# Patient Record
Sex: Female | Born: 1985 | Race: White | Hispanic: No | Smoking: Current some day smoker
Health system: Southern US, Community
[De-identification: ages and names within clinical notes are randomized; demographics above are authoritative.]

## PROBLEM LIST (undated history)

## (undated) DIAGNOSIS — F419 Anxiety disorder, unspecified: Secondary | ICD-10-CM

## (undated) DIAGNOSIS — G8929 Other chronic pain: Secondary | ICD-10-CM

## (undated) DIAGNOSIS — I10 Essential (primary) hypertension: Secondary | ICD-10-CM

## (undated) DIAGNOSIS — R51 Headache: Secondary | ICD-10-CM

## (undated) DIAGNOSIS — K529 Noninfective gastroenteritis and colitis, unspecified: Secondary | ICD-10-CM

## (undated) DIAGNOSIS — R519 Headache, unspecified: Secondary | ICD-10-CM

## (undated) DIAGNOSIS — N83209 Unspecified ovarian cyst, unspecified side: Secondary | ICD-10-CM

## (undated) HISTORY — PX: WISDOM TOOTH EXTRACTION: SHX21

## (undated) HISTORY — DX: Headache: R51

## (undated) HISTORY — DX: Anxiety disorder, unspecified: F41.9

## (undated) HISTORY — DX: Other chronic pain: G89.29

## (undated) HISTORY — DX: Headache, unspecified: R51.9

---

## 2006-10-04 ENCOUNTER — Other Ambulatory Visit: Admission: RE | Admit: 2006-10-04 | Discharge: 2006-10-04 | Payer: Self-pay | Admitting: Obstetrics & Gynecology

## 2008-04-17 ENCOUNTER — Inpatient Hospital Stay (HOSPITAL_COMMUNITY): Admission: AD | Admit: 2008-04-17 | Discharge: 2008-04-19 | Payer: Self-pay | Admitting: Obstetrics and Gynecology

## 2008-04-21 ENCOUNTER — Ambulatory Visit: Admission: RE | Admit: 2008-04-21 | Discharge: 2008-04-21 | Payer: Self-pay | Admitting: Obstetrics and Gynecology

## 2009-06-07 ENCOUNTER — Ambulatory Visit: Payer: Self-pay | Admitting: Interventional Radiology

## 2009-06-07 ENCOUNTER — Emergency Department (HOSPITAL_BASED_OUTPATIENT_CLINIC_OR_DEPARTMENT_OTHER): Admission: EM | Admit: 2009-06-07 | Discharge: 2009-06-07 | Payer: Self-pay | Admitting: Emergency Medicine

## 2010-07-25 ENCOUNTER — Emergency Department (HOSPITAL_BASED_OUTPATIENT_CLINIC_OR_DEPARTMENT_OTHER)
Admission: EM | Admit: 2010-07-25 | Discharge: 2010-07-25 | Disposition: A | Discharge status: Home / Self Care | Payer: Medicaid Other | Attending: Emergency Medicine | Admitting: Emergency Medicine

## 2010-07-25 DIAGNOSIS — R110 Nausea: Secondary | ICD-10-CM | POA: Insufficient documentation

## 2010-07-25 DIAGNOSIS — R5381 Other malaise: Secondary | ICD-10-CM | POA: Insufficient documentation

## 2010-07-25 DIAGNOSIS — R5383 Other fatigue: Secondary | ICD-10-CM | POA: Insufficient documentation

## 2010-07-25 LAB — URINALYSIS, ROUTINE W REFLEX MICROSCOPIC
Hgb urine dipstick: NEGATIVE
Specific Gravity, Urine: 1.011 (ref 1.005–1.030)
Urine Glucose, Fasting: NEGATIVE mg/dL
pH: 7.5 (ref 5.0–8.0)

## 2010-07-25 LAB — CBC
HCT: 41.8 % (ref 36.0–46.0)
MCV: 85.7 fL (ref 78.0–100.0)
RBC: 4.88 MIL/uL (ref 3.87–5.11)
WBC: 5.6 10*3/uL (ref 4.0–10.5)

## 2010-07-25 LAB — DIFFERENTIAL
Basophils Absolute: 0 10*3/uL (ref 0.0–0.1)
Lymphocytes Relative: 46 % (ref 12–46)
Lymphs Abs: 2.5 10*3/uL (ref 0.7–4.0)
Neutro Abs: 2.3 10*3/uL (ref 1.7–7.7)
Neutrophils Relative %: 42 % — ABNORMAL LOW (ref 43–77)

## 2010-07-25 LAB — BASIC METABOLIC PANEL
BUN: 11 mg/dL (ref 6–23)
Chloride: 106 mEq/L (ref 96–112)
Glucose, Bld: 90 mg/dL (ref 70–99)
Potassium: 4 mEq/L (ref 3.5–5.1)
Sodium: 141 mEq/L (ref 135–145)

## 2010-07-25 LAB — PREGNANCY, URINE: Preg Test, Ur: NEGATIVE

## 2010-11-01 NOTE — H&P (Signed)
NAMEALVETA, Williams NO.:  [id number]   MEDICAL RECORD NO.:  [id number]          PATIENT TYPE:  INP   LOCATION:  9167                          FACILITY:  [facility]   PHYSICIAN:  Osborn Coho, M.D.   DATE OF BIRTH:  [birth year]   DATE OF ADMISSION:  04/17/2008  DATE OF DISCHARGE:                              HISTORY & PHYSICAL   [name] is a 25 year old single white female primigravida at 40-3/7  weeks' gestation per an [facility] of April 14, 2008 who presents in early  labor.  She reports contractions which were irregular throughout the day  yesterday that became approximately 5 minutes apart around 11 p.m., and  then since midnight have been somewhere around 2 minutes apart.  She  reports good fetal movement.  No leakage of fluid, vaginal bleeding, UTI  or PIH signs or symptoms, fever, cough, shortness of breath, nausea,  vomiting or diarrhea.  She has been followed by CNM service at [facility].  History remarkable for:   1. Conception on OCPs.  2. History of frequent headaches.  3. History of abnormal Pap.  4. Best EDC is by 7-week ultrasound at [facility].   OBSTETRICAL HISTORY:  She is a primigravida.   PRENATAL LABORATORIES:  Her blood type is AB positive, Rh antibody  screen negative, RPR nonreactive, rubella titer immune, hepatitis  surface antigen negative, HIV nonreactive, declined cystic fibrosis  screen.  Pap and gonorrhea and chlamydia cultures were negative as were  done on April 29th.  Hemoglobin at that time 12.3, hematocrit 35.6, and  platelets were 197.  Third trimester group beta strep is negative; 1-  hour Glucola was within normal limits equal to 93.   ALLERGIES:  She denies medication or latex allergies.  She does have  some seasonal allergies.   MENSTRUAL HISTORY:  She reports menarche at age 64, monthly cycles.  No  abnormalities.  She had a certain LMP of July 18, 2007 giving her an  [facility] April 23, 2008.  However, she did have a  7-week ultrasound at  [facility] giving her a best [facility] of April 14, 2008 which is  what we have used.  She had reported being on Yaz, and had missed 1 week  of pills.  She stopped when she did find out she was pregnant so  conceived while on [place].  She had an abnormal Pap smear in 2006, and had  a subsequent colposcopy which was within normal limits.  She was treated  for chlamydia in 2008, varicella as a child.   SURGICAL HISTORY:  Remarkable for wisdom teeth in approximately 2007.   GENETIC HISTORY:  Remarkable for 1st cousin on paternal side born with  heart disease and subsequently died; then she has a paternal 1st cousin  with heart disease and also mental retardation.   FAMILY HISTORY:  Maternal grandmother heart disease.  Her dad has  chronic hypertension and is on meds.  Paternal grandfather and maternal  grandfather lung cancer and deceased.  Mom bipolar, ADHD, brother with  ADHD.   SOCIAL HISTORY:  She is a  single white female.  She is of [place]  faith.  Father of baby's name is Mckenzie Williams.  He is not present on  arrival today and unsure if he is involved in the pregnancy.  Patient is  a part-time Archivist.  The father of baby has had some college.  The patient denied alcohol, tobacco or illicit drug use.   HISTORY OF PRESENT PREGNANCY:  She entered care for an OB interview on  April the 15th.  Her height is 5 feet 8 inches.  Her pregravid weight  was around 149.  She had her new OB workup on April 29th with Nigel Bridgeman, certified nurse midwife.  She planned CNM care.  Pap and  cultures were negative.  She had anatomy ultrasound at 19-1/7 weeks.  They are expecting a boy.  His name is Mckenzie Williams.  Anatomy was within  normal limits.  Size was consistent with EDC of April 14, 2008.  Was  having some occasional cramping, occasional headaches, and was using  ibuprofen p.r.n. with good benefit.  She had her Glucola at 28-1/7  weeks, and it was within  normal limits equal to 93.  The patient did  attend both childbirth and breastfeeding class at [facility] during  the pregnancy in her 3rd trimester.  The patient's pregnancy continued  to progress without any other significant issues or complications.  She  did have a slightly elevated blood pressure at 38-1/7 weeks that was not  high.  It was borderline 138/78 and a recheck was 118/64.  She did  receive the H1N1 shot on Tuesday, October 27th, in our office.  Tuesday  in the office her cervix was 1 cm at 70% and -2.   OBJECTIVE:  Vital signs are unavailable at the time of this dictation.  Fetal heart rate is running at a baseline of 140.  It is reactive,  moderate variability.  She had probably 3 or 4 lates around 3 a.m. after  being on the monitor that spontaneously resolved.  It was during a  period of minimal to absent variability.  She has had a few brief mild  variables.  Overall tracing is reassuring.  Toco uterine contractions  every 2-3 minutes which are moderate on palpation.   PHYSICAL EXAMINATION:  GENERAL:  She does have grimace and labored  breathing with her contractions, but she is alert and oriented x3 and  pleasant in between.  HEENT:  Grossly intact and within normal limits.  CARDIOVASCULAR:  Regular rate and rhythm without murmur.  LUNGS:  Clear to auscultation bilaterally.  ABDOMEN:  Soft, nontender, and gravid.  Cervix is 3%, -1, with a bulging  bag of water.  EXTREMITIES:  No edema, no clonus, and DTRs are within  normal limits.   IMPRESSION:  1. Intrauterine pregnancy at 40-3/7 weeks.  2. Early labor.  3. Few decelerations on fetal heart tracing, but overall very      reassuring.  4. Group beta strep is negative.   PLAN:  1. Admit to birthing suites, Dr. Su Hilt as attending physician.  2. Routine L and D orders.  3. Encouraged Stadol or epidural p.r.n.  The patient is unsure at this      time.  4. AROM p.r.n. augmentation.  5. Consult with MD as  needed.      Candice Denny Levy, CNM      ______________________________  Osborn Coho, M.D.    CHS/MEDQ  D:  04/17/2008  T:  04/17/2008  Job:  [id number] 

## 2011-03-21 LAB — CBC
HCT: 32.8 — ABNORMAL LOW
HCT: 37.4
Hemoglobin: 12.6
MCHC: 33.7
MCV: 89.7
MCV: 90.3
Platelets: 176
RBC: 3.63 — ABNORMAL LOW
RDW: 13.1
WBC: 13.7 — ABNORMAL HIGH
WBC: 14.4 — ABNORMAL HIGH

## 2012-06-18 ENCOUNTER — Emergency Department (HOSPITAL_COMMUNITY): Payer: Self-pay

## 2012-06-18 ENCOUNTER — Encounter (HOSPITAL_COMMUNITY): Payer: Self-pay | Admitting: *Deleted

## 2012-06-18 ENCOUNTER — Emergency Department (HOSPITAL_COMMUNITY)
Admission: EM | Admit: 2012-06-18 | Discharge: 2012-06-18 | Disposition: A | Discharge status: Home / Self Care | Payer: Self-pay | Attending: Emergency Medicine | Admitting: Emergency Medicine

## 2012-06-18 DIAGNOSIS — Z7982 Long term (current) use of aspirin: Secondary | ICD-10-CM | POA: Insufficient documentation

## 2012-06-18 DIAGNOSIS — R1110 Vomiting, unspecified: Secondary | ICD-10-CM | POA: Insufficient documentation

## 2012-06-18 DIAGNOSIS — F17200 Nicotine dependence, unspecified, uncomplicated: Secondary | ICD-10-CM | POA: Insufficient documentation

## 2012-06-18 DIAGNOSIS — Z3202 Encounter for pregnancy test, result negative: Secondary | ICD-10-CM | POA: Insufficient documentation

## 2012-06-18 DIAGNOSIS — R091 Pleurisy: Secondary | ICD-10-CM | POA: Insufficient documentation

## 2012-06-18 DIAGNOSIS — Z79899 Other long term (current) drug therapy: Secondary | ICD-10-CM | POA: Insufficient documentation

## 2012-06-18 LAB — CBC WITH DIFFERENTIAL/PLATELET
Basophils Relative: 0 % (ref 0–1)
Eosinophils Relative: 1 % (ref 0–5)
Lymphocytes Relative: 19 % (ref 12–46)
MCH: 32.4 pg (ref 26.0–34.0)
MCV: 92.6 fL (ref 78.0–100.0)
Monocytes Absolute: 0.5 10*3/uL (ref 0.1–1.0)
Monocytes Relative: 7 % (ref 3–12)
Neutro Abs: 5.3 10*3/uL (ref 1.7–7.7)
Neutrophils Relative %: 73 % (ref 43–77)
Platelets: 177 10*3/uL (ref 150–400)
RDW: 12.4 % (ref 11.5–15.5)

## 2012-06-18 LAB — POCT I-STAT TROPONIN I

## 2012-06-18 LAB — URINALYSIS, ROUTINE W REFLEX MICROSCOPIC
Glucose, UA: NEGATIVE mg/dL
Nitrite: NEGATIVE
Protein, ur: NEGATIVE mg/dL
Urobilinogen, UA: 0.2 mg/dL (ref 0.0–1.0)
pH: 7 (ref 5.0–8.0)

## 2012-06-18 LAB — BASIC METABOLIC PANEL
CO2: 25 mEq/L (ref 19–32)
Calcium: 9.9 mg/dL (ref 8.4–10.5)
GFR calc Af Amer: >90 mL/min (ref >90)
GFR calc non Af Amer: >90 mL/min (ref >90)
Potassium: 3.9 mEq/L (ref 3.5–5.1)
Sodium: 133 mEq/L — ABNORMAL LOW (ref 135–145)

## 2012-06-18 MED ORDER — OXYCODONE-ACETAMINOPHEN 5-325 MG PO TABS
2.0000 | ORAL_TABLET | Freq: Once | ORAL | Status: DC
  Filled 2012-06-18: qty 2

## 2012-06-18 MED ORDER — SODIUM CHLORIDE 0.9 % IV SOLN
1000.0000 mL | Freq: Once | INTRAVENOUS | Status: AC
  Administered 2012-06-18: 1000 mL via INTRAVENOUS

## 2012-06-18 MED ORDER — OXYCODONE-ACETAMINOPHEN 10-650 MG PO TABS
1.0000 | ORAL_TABLET | Freq: Four times a day (QID) | ORAL | Status: DC | PRN
Start: 2012-06-18 — End: 2017-09-19

## 2012-06-18 MED ORDER — ONDANSETRON HCL 4 MG/2ML IJ SOLN
4.0000 mg | INTRAMUSCULAR | Status: DC
  Filled 2012-06-18: qty 2

## 2012-06-18 MED ORDER — ONDANSETRON 8 MG PO TBDP: 8.0000 mg | ORAL_TABLET | Freq: Once | ORAL | Status: DC

## 2012-06-18 MED ORDER — SODIUM CHLORIDE 0.9 % IV SOLN
1000.0000 mL | INTRAVENOUS | Status: DC
  Administered 2012-06-18: 1000 mL via INTRAVENOUS

## 2012-06-18 MED ORDER — NAPROXEN 500 MG PO TABS: 500.0000 mg | ORAL_TABLET | Freq: Two times a day (BID) | ORAL | Status: DC

## 2012-06-18 NOTE — ED Provider Notes (Signed)
History     CSN: [id number]  Arrival date & time 06/18/12  1240   First MD Initiated Contact with Patient 06/18/12 1303      Chief Complaint  Patient presents with  . Chest Pain    (Consider location/radiation/quality/duration/timing/severity/associated sxs/prior treatment) The history is provided by the patient and medical records.    Mckenzie Williams is a 26 y.o. female  with No known medical Hx presents to the Emergency Department complaining of acute, persistent, progressively resolving Chest pain onset 1 hr prior to arrival.  Pt states she was seen 1 week ago and diagnosed with the flu.  She was given an Rx for Azithromycin which she did not fill or take. Patient states at that time she was told she had high heart rate. She states she's been monitoring her heart rate at home. Today she had acute onset of sharp left-sided chest pain, she took her pulse was 120. She became anxious and when she checked it  again she states it was 160. She states she felt a "sharp pop in her chest" she became nervous and came to the emergency department.  She states the pain is made significantly worse when she takes a deep breath.  She also states she has been vomiting 2x per day for the last week but has been able to keep fluids down.  Associated symptoms include cardiac, chest pain.  Nothing makes it better and deep breathing makes it worse.  Pt denies fever, chills, headache, neck pain, prescribed, abdominal pain, nausea vomiting, diarrhea melena, dizziness, syncope.     History reviewed. No pertinent past medical history.  Past Surgical History  Procedure Date  . Wisdom tooth extraction     No family history on file.  History  Substance Use Topics  . Smoking status: Current Every Day Smoker -- 0.5 packs/day for 4 years  . Smokeless tobacco: Not on file  . Alcohol Use: Yes     Comment: 2-3 drinks/ day    OB History    Grav Para Term Preterm Abortions TAB SAB Ect Mult Living           Review of Systems  Constitutional: Negative for fever, diaphoresis, appetite change, fatigue and unexpected weight change.  HENT: Negative for mouth sores and neck stiffness.   Eyes: Negative for visual disturbance.  Respiratory: Negative for cough, chest tightness, shortness of breath and wheezing.   Cardiovascular: Positive for chest pain.  Gastrointestinal: Negative for nausea, vomiting, abdominal pain, diarrhea and constipation.  Genitourinary: Negative for dysuria, urgency, frequency and hematuria.  Skin: Negative for rash.  Neurological: Negative for syncope, light-headedness and headaches.  Psychiatric/Behavioral: Negative for sleep disturbance. The patient is not nervous/anxious.   All other systems reviewed and are negative.    Allergies  Review of patient's allergies indicates no known allergies.  Home Medications   Current Outpatient Rx  Name  Route  Sig  Dispense  Refill  . ALPRAZOLAM 0.5 MG PO TABS   Oral   Take 0.25 mg by mouth daily as needed. Anxiety         . AMPHETAMINE-DEXTROAMPHETAMINE 15 MG PO TABS   Oral   Take 15 mg by mouth daily.         . ASPIRIN 81 MG PO TABS   Oral   Take 81 mg by mouth daily.         . IBUPROFEN 200 MG PO TABS   Oral   Take 200 mg by mouth every 6 (six)  hours as needed. Pain         . NAPROXEN 500 MG PO TABS   Oral   Take 1 tablet (500 mg total) by mouth 2 (two) times daily with a meal.   30 tablet   0   . OXYCODONE-ACETAMINOPHEN 10-650 MG PO TABS   Oral   Take 1 tablet by mouth every 6 (six) hours as needed for pain.   30 tablet   0     BP 134/82  Pulse 107  Temp 97.6 F (36.4 C) (Oral)  Resp 16  SpO2 100%  LMP 06/17/2012  Physical Exam  Nursing note and vitals reviewed. Constitutional: She is oriented to person, place, and time. She appears well-developed and well-nourished. No distress.  HENT:  Head: Normocephalic and atraumatic.  Right Ear: Tympanic membrane, external ear and ear  canal normal.  Left Ear: Tympanic membrane, external ear and ear canal normal.  Nose: Nose normal. Right sinus exhibits no maxillary sinus tenderness and no frontal sinus tenderness. Left sinus exhibits no maxillary sinus tenderness and no frontal sinus tenderness.  Mouth/Throat: Uvula is midline, oropharynx is clear and moist and mucous membranes are normal. Mucous membranes are not dry. No oropharyngeal exudate, posterior oropharyngeal edema, posterior oropharyngeal erythema or tonsillar abscesses.  Eyes: Conjunctivae normal and EOM are normal. Pupils are equal, round, and reactive to light. No scleral icterus.  Neck: Normal range of motion. Neck supple.  Cardiovascular: Regular rhythm, S1 normal, S2 normal, normal heart sounds and intact distal pulses.  Tachycardia present.   No murmur heard. Pulses:      Radial pulses are 2+ on the right side, and 2+ on the left side.       Dorsalis pedis pulses are 2+ on the right side, and 2+ on the left side.       Posterior tibial pulses are 2+ on the right side, and 2+ on the left side.  Pulmonary/Chest: Effort normal and breath sounds normal. No respiratory distress. She has no wheezes.  Abdominal: Soft. Bowel sounds are normal. She exhibits no distension and no mass. There is no tenderness. There is no rebound and no guarding.  Musculoskeletal: Normal range of motion. She exhibits no edema and no tenderness.  Lymphadenopathy:    She has no cervical adenopathy.  Neurological: She is alert and oriented to person, place, and time. She exhibits normal muscle tone. Coordination normal.       Speech is clear and goal oriented Moves extremities without ataxia  Skin: Skin is warm and dry. No rash noted. She is not diaphoretic. No erythema.  Psychiatric: She has a normal mood and affect.    ED Course  Procedures (including critical care time)  Labs Reviewed  BASIC METABOLIC PANEL - Abnormal; Notable for the following:    Sodium 133 (*)     Chloride 95  (*)     Creatinine, Ser 0.42 (*)     All other components within normal limits  URINALYSIS, ROUTINE W REFLEX MICROSCOPIC - Abnormal; Notable for the following:    Specific Gravity, Urine 1.003 (*)     All other components within normal limits  CBC WITH DIFFERENTIAL  PREGNANCY, URINE  D-DIMER, QUANTITATIVE  POCT I-STAT TROPONIN I   Dg Chest 2 View  06/18/2012  *RADIOLOGY REPORT*  Clinical Data: Left-sided chest pain.  Smoker.  CHEST - 2 VIEW  Comparison:  None.  Findings:  The heart size and mediastinal contours are within normal limits.  Both lungs are clear.  No evidence of pneumothorax or pleural effusion.  The visualized skeletal structures are unremarkable.  IMPRESSION: No active cardiopulmonary disease.   Original Report Authenticated By: Myles Rosenthal, M.D.    ECG:  Date: 06/18/2012  Rate: 120  Rhythm: sinus tachycardia  QRS Axis: normal  Intervals: normal  ST/T Wave abnormalities: normal  Conduction Disutrbances:none  Narrative Interpretation: Nonischemic ECG  Old EKG Reviewed: none available    1. Pleurisy       MDM  Mckenzie Williams presents with chest pain and tachycardia.  Patient is to be discharged with recommendation to follow up with PCP in regards to today's hospital visit. Chest pain is not likely of cardiac or pulmonary etiology d/t presentation, negative d-dimer, VSS, no tracheal deviation, no JVD or new murmur, regular rhythm, breath sounds equal bilaterally, EKG without acute abnormalities, negative troponin, and negative CXR. Pt remains tachycardic to 105 after fluid, but dizziness has resolved.  Pt has been advised to begin anti-inflammatory regimen and take percocet for pain.  Pt also advised to return to the ED is CP becomes exertional, associated with diaphoresis or nausea, radiates to left jaw/arm, worsens or becomes concerning in any way. Pt appears reliable for follow up and is agreeable to discharge.   Case has been discussed with Dr. Gwyneth Sprout who agrees with the above plan to discharge.   1. Medications: percocet, naprosyn, usual home medications  2. Treatment: rest, drink plenty of fluids, take medications as prescribed  3. Follow Up: Please followup with your primary doctor for discussion of your diagnoses and further evaluation after today's visit; if you do not have a primary care doctor use the resource guide provided to find one;          Dierdre Forth, PA-C 06/18/12 1659  Zymir Napoli, PA-C 06/18/12 1709

## 2012-06-18 NOTE — ED Provider Notes (Signed)
Medical screening examination/treatment/procedure(s) were performed by non-physician practitioner and as supervising physician I was immediately available for consultation/collaboration.   Gwyneth Sprout, MD 06/18/12 [phone]

## 2012-06-18 NOTE — ED Notes (Signed)
Pt reports flu like symptoms 1 week ago. Pt reports she has been tachycardic for last week. Reports chest pain started today. Left sided chest pain 3/10, stabbing. Pt reports vomit x1 this am and dizziness. Hx of smoking.

## 2012-11-07 ENCOUNTER — Encounter (HOSPITAL_BASED_OUTPATIENT_CLINIC_OR_DEPARTMENT_OTHER): Payer: Self-pay | Admitting: *Deleted

## 2012-11-07 ENCOUNTER — Emergency Department (HOSPITAL_BASED_OUTPATIENT_CLINIC_OR_DEPARTMENT_OTHER)
Admission: EM | Admit: 2012-11-07 | Discharge: 2012-11-07 | Disposition: A | Discharge status: Home / Self Care | Payer: Self-pay | Attending: Emergency Medicine | Admitting: Emergency Medicine

## 2012-11-07 DIAGNOSIS — R110 Nausea: Secondary | ICD-10-CM | POA: Insufficient documentation

## 2012-11-07 DIAGNOSIS — F17200 Nicotine dependence, unspecified, uncomplicated: Secondary | ICD-10-CM | POA: Insufficient documentation

## 2012-11-07 DIAGNOSIS — R599 Enlarged lymph nodes, unspecified: Secondary | ICD-10-CM | POA: Insufficient documentation

## 2012-11-07 DIAGNOSIS — J029 Acute pharyngitis, unspecified: Secondary | ICD-10-CM | POA: Insufficient documentation

## 2012-11-07 LAB — RAPID STREP SCREEN (MED CTR MEBANE ONLY): Streptococcus, Group A Screen (Direct): NEGATIVE

## 2012-11-07 NOTE — ED Provider Notes (Signed)
Medical screening examination/treatment/procedure(s) were performed by non-physician practitioner and as supervising physician I was immediately available for consultation/collaboration.   Hashem Goynes W. Jessilyn Catino, MD 11/07/12 1859 

## 2012-11-07 NOTE — ED Notes (Signed)
Sore throat x 2 days. No known strep exposure.

## 2012-11-07 NOTE — ED Provider Notes (Signed)
History     CSN: [id number]  Arrival date & time 11/07/12  1431   First MD Initiated Contact with Patient 11/07/12 1557      Chief Complaint  Patient presents with  . Sore Throat    (Consider location/radiation/quality/duration/timing/severity/associated sxs/prior treatment) The history is provided by the patient. No language interpreter was used.   Pt is a 26yo female c/o sore throat x2 days associated with "white spots" on tonsils x1 day.  Pt states pain is moderate in severity and worse with swallowing.  Has not tried anything for pain.  Denies fever, cough, n/v/d.  Denies difficulty breathing or swallowing.  Denies sick contacts or recent travel. Pt does not have hx of asthma.    History reviewed. No pertinent past medical history.  History reviewed. No pertinent past surgical history.  No family history on file.  History  Substance Use Topics  . Smoking status: Current Every Day Smoker -- 0.50 packs/day    Types: Cigarettes  . Smokeless tobacco: Not on file  . Alcohol Use: Yes    OB History   Grav Para Term Preterm Abortions TAB SAB Ect Mult Living                  Review of Systems  Constitutional: Negative for fever and chills.  HENT: Positive for sore throat. Negative for drooling, trouble swallowing, dental problem and voice change.   Gastrointestinal: Positive for nausea.  All other systems reviewed and are negative.    Allergies  Review of patient's allergies indicates no known allergies.  Home Medications  No current outpatient prescriptions on file.  BP 142/98  Pulse 108  Temp(Src) 98.3 F (36.8 C) (Oral)  SpO2 98%  Physical Exam  Nursing note and vitals reviewed. Constitutional: She appears well-developed and well-nourished. No distress.  Pt is a pleasant female sitting up in exam bed watching television, resting comfortably. NAD.   HENT:  Head: Normocephalic and atraumatic. No trismus in the jaw.  Right Ear: Hearing, tympanic membrane,  external ear and ear canal normal.  Left Ear: Hearing, tympanic membrane, external ear and ear canal normal.  Nose: Nose normal.  Mouth/Throat: Uvula is midline and mucous membranes are normal. She does not have dentures. No oral lesions. Normal dentition. No dental abscesses, edematous, lacerations or dental caries. Oropharyngeal exudate, posterior oropharyngeal edema and posterior oropharyngeal erythema present. No tonsillar abscesses.  Eyes: Conjunctivae are normal. No scleral icterus.  Neck: Normal range of motion. Neck supple.  No nuchal rigidity or meningeal signs   Cardiovascular: Normal rate, regular rhythm and normal heart sounds.   Pulmonary/Chest: Effort normal and breath sounds normal. No respiratory distress. She has no wheezes. She has no rales. She exhibits no tenderness.  Abdominal: Soft. Bowel sounds are normal. She exhibits no distension and no mass. There is no tenderness. There is no rebound and no guarding.  Abd soft, NDNT, no hepatosplenomegaly   Musculoskeletal: Normal range of motion.  Lymphadenopathy:    She has cervical adenopathy ( anterior).  Neurological: She is alert.  Skin: Skin is warm and dry. She is not diaphoretic.    ED Course  Procedures (including critical care time)  Labs Reviewed  RAPID STREP SCREEN  CULTURE, GROUP A STREP   No results found.   1. Viral pharyngitis       MDM  Pt c/o sore throat x2 days, noticed white spots on tonsils yesterday.  Mild nausea.  No known exposure to strep.  Denies fever, HA, cough,  trouble breathing or swallowing, no abd pain, vomiting or diarrhea.  PE: pt appears well, NAD, Oropharynx: tonsillar exudate with bilateral tonsillar erythema and edema.  No peritonsillar abscess.  Discharged home.  Provided pt with resource guide.  Her previous PCP just retired.    Rapid strep: neg   May use tylenol and ibuprofen as needed for pain or fever.  Salt water gargle. Follow up with PCP or urgent care in 3-4 days if not  improving, sooner if symptoms worsening.  Return to ED if difficulty breathing or swallowing.  Vitals: unremarkable. Discharged in stable condition.            Junius Finner, PA-C 11/07/12 1802

## 2012-11-11 LAB — CULTURE, GROUP A STREP

## 2013-11-02 ENCOUNTER — Emergency Department (HOSPITAL_COMMUNITY): Payer: Medicaid Other

## 2013-11-02 ENCOUNTER — Emergency Department (HOSPITAL_COMMUNITY)
Admission: EM | Admit: 2013-11-02 | Discharge: 2013-11-03 | Disposition: A | Discharge status: Home / Self Care | Payer: Medicaid Other | Attending: Emergency Medicine | Admitting: Emergency Medicine

## 2013-11-02 ENCOUNTER — Encounter (HOSPITAL_COMMUNITY): Payer: Self-pay | Admitting: Emergency Medicine

## 2013-11-02 DIAGNOSIS — S01501A Unspecified open wound of lip, initial encounter: Secondary | ICD-10-CM | POA: Insufficient documentation

## 2013-11-02 DIAGNOSIS — IMO0002 Reserved for concepts with insufficient information to code with codable children: Secondary | ICD-10-CM | POA: Insufficient documentation

## 2013-11-02 DIAGNOSIS — S0180XA Unspecified open wound of other part of head, initial encounter: Secondary | ICD-10-CM | POA: Insufficient documentation

## 2013-11-02 DIAGNOSIS — F17200 Nicotine dependence, unspecified, uncomplicated: Secondary | ICD-10-CM | POA: Insufficient documentation

## 2013-11-02 DIAGNOSIS — Y92410 Unspecified street and highway as the place of occurrence of the external cause: Secondary | ICD-10-CM | POA: Insufficient documentation

## 2013-11-02 DIAGNOSIS — Y9389 Activity, other specified: Secondary | ICD-10-CM | POA: Insufficient documentation

## 2013-11-02 DIAGNOSIS — S025XXA Fracture of tooth (traumatic), initial encounter for closed fracture: Secondary | ICD-10-CM

## 2013-11-02 DIAGNOSIS — S0120XA Unspecified open wound of nose, initial encounter: Secondary | ICD-10-CM | POA: Insufficient documentation

## 2013-11-02 DIAGNOSIS — T07XXXA Unspecified multiple injuries, initial encounter: Secondary | ICD-10-CM

## 2013-11-02 DIAGNOSIS — S0990XA Unspecified injury of head, initial encounter: Secondary | ICD-10-CM | POA: Insufficient documentation

## 2013-11-02 DIAGNOSIS — S0181XA Laceration without foreign body of other part of head, initial encounter: Secondary | ICD-10-CM

## 2013-11-02 DIAGNOSIS — S01512A Laceration without foreign body of oral cavity, initial encounter: Secondary | ICD-10-CM

## 2013-11-02 DIAGNOSIS — S8010XA Contusion of unspecified lower leg, initial encounter: Secondary | ICD-10-CM | POA: Insufficient documentation

## 2013-11-02 DIAGNOSIS — S0121XA Laceration without foreign body of nose, initial encounter: Secondary | ICD-10-CM

## 2013-11-02 DIAGNOSIS — S8000XA Contusion of unspecified knee, initial encounter: Secondary | ICD-10-CM | POA: Insufficient documentation

## 2013-11-02 DIAGNOSIS — S01502A Unspecified open wound of oral cavity, initial encounter: Secondary | ICD-10-CM | POA: Insufficient documentation

## 2013-11-02 MED ORDER — ONDANSETRON HCL 4 MG/2ML IJ SOLN
4.0000 mg | Freq: Once | INTRAMUSCULAR | Status: AC
  Administered 2013-11-02: 4 mg via INTRAVENOUS
  Filled 2013-11-02: qty 2

## 2013-11-02 MED ORDER — FENTANYL CITRATE 0.05 MG/ML IJ SOLN
50.0000 ug | INTRAMUSCULAR | Status: DC | PRN
  Administered 2013-11-02 – 2013-11-03 (×2): 50 ug via INTRAVENOUS
  Filled 2013-11-02 (×2): qty 2

## 2013-11-02 NOTE — ED Notes (Signed)
Pt to CT at this time.
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
## 2013-11-03 ENCOUNTER — Emergency Department (HOSPITAL_COMMUNITY): Payer: Medicaid Other

## 2013-11-03 MED ORDER — HYDROMORPHONE HCL PF 1 MG/ML IJ SOLN
1.0000 mg | Freq: Once | INTRAMUSCULAR | Status: AC
  Administered 2013-11-03: 1 mg via INTRAVENOUS
  Filled 2013-11-03: qty 1

## 2013-11-03 MED ORDER — ONDANSETRON 8 MG PO TBDP: ORAL_TABLET | ORAL | Status: DC

## 2013-11-03 MED ORDER — AMOXICILLIN-POT CLAVULANATE 875-125 MG PO TABS: 1.0000 | ORAL_TABLET | Freq: Two times a day (BID) | ORAL | Status: DC

## 2013-11-03 MED ORDER — ONDANSETRON HCL 4 MG/2ML IJ SOLN: 4.0000 mg | Freq: Once | INTRAMUSCULAR | Status: DC

## 2013-11-03 MED ORDER — HYDROCODONE-ACETAMINOPHEN 5-325 MG PO TABS: 2.0000 | ORAL_TABLET | ORAL | Status: DC | PRN

## 2013-11-03 MED ORDER — FENTANYL CITRATE 0.05 MG/ML IJ SOLN
50.0000 ug | Freq: Once | INTRAMUSCULAR | Status: AC
  Administered 2013-11-03: 50 ug via INTRAVENOUS
  Filled 2013-11-03: qty 2

## 2013-11-03 MED ORDER — WHITE PETROLATUM GEL
Status: AC
  Administered 2013-11-03: 1
  Filled 2013-11-03: qty 5

## 2013-11-03 NOTE — Discharge Instructions (Signed)
1. Medications: Vicodin for pain, Zofran for nausea, augmentin is your antibiotic; usual home medications 2. Treatment: Do not eat or drink anything until you're seen by the dentist tomorrow, keep wounds clean with warm soap and water 3. Follow Up: Please followup with the doctors listed on your discharge instructions  Tooth Injuries A tooth has many layers. The outside is the enamel. The enamel is the white part. Under the enamel is the dentin. Under the dentin is the pulp, the nerves, and the blood vessels. The top part is the crown. The bottom part is the root. Three common tooth injuries include:  Breaks (fractures) usually split the tooth into 2 or more parts.  Shifts of the tooth at the level of the root.  A tooth that comes out. HOME CARE  Minor breaks usually do not require seeing a dentist right away.  Handle tooth fragments by the outside layer. Bring these fragments to the dentist.  A tooth can also be loosened by injury and show no sign of a problem. See a dentist for an X-ray. The X-ray will look for problems below the gum line.  Gently biting into gauze or a towel will help control bleeding. An exposed nerve requires a dental exam and care. Getting help right away is not needed if the pain is controlled.  Only take medicine as told by your dentist.  Doreatha MartinFinish all medicine as told by your dentist.  Avoid eating solid foods and see a dentist within 24 hours. GET HELP RIGHT AWAY IF:   The pain is becoming worse rather than better.  The pain does not get better with medicine.  You have increased puffiness (swelling) or redness in your face near the injured tooth. MAKE SURE YOU:  Understand these instructions.  Will watch your condition.  Will get help right away if you are not doing well or get worse. Document Released: 11/23/2009 Document Revised: 08/28/2011 Document Reviewed: 11/23/2009 [facility] Patient Information 2014 [place], [place].    Abrasion An abrasion  is a cut or scrape of the skin. Abrasions do not extend through all layers of the skin and most heal within 10 days. It is important to care for your abrasion properly to prevent infection. CAUSES  Most abrasions are caused by falling on, or gliding across, the ground or other surface. When your skin rubs on something, the outer and inner layer of skin rubs off, causing an abrasion. DIAGNOSIS  Your caregiver will be able to diagnose an abrasion during a physical exam.  TREATMENT  Your treatment depends on how large and deep the abrasion is. Generally, your abrasion will be cleaned with water and a mild soap to remove any dirt or debris. An antibiotic ointment may be put over the abrasion to prevent an infection. A bandage (dressing) may be wrapped around the abrasion to keep it from getting dirty.  You may need a tetanus shot if:  You cannot remember when you had your last tetanus shot.  You have never had a tetanus shot.  The injury broke your skin. If you get a tetanus shot, your arm may swell, get red, and feel warm to the touch. This is common and not a problem. If you need a tetanus shot and you choose not to have one, there is a rare chance of getting tetanus. Sickness from tetanus can be serious.  HOME CARE INSTRUCTIONS   If a dressing was applied, change it at least once a day or as directed by your caregiver.  If the bandage sticks, soak it off with warm water.   Wash the area with water and a mild soap to remove all the ointment 2 times a day. Rinse off the soap and pat the area dry with a clean towel.   Reapply any ointment as directed by your caregiver. This will help prevent infection and keep the bandage from sticking. Use gauze over the wound and under the dressing to help keep the bandage from sticking.   Change your dressing right away if it becomes wet or dirty.   Only take over-the-counter or prescription medicines for pain, discomfort, or fever as directed by your  caregiver.   Follow up with your caregiver within 24 48 hours for a wound check, or as directed. If you were not given a wound-check appointment, look closely at your abrasion for redness, swelling, or pus. These are signs of infection. SEEK IMMEDIATE MEDICAL CARE IF:   You have increasing pain in the wound.   You have redness, swelling, or tenderness around the wound.   You have pus coming from the wound.   You have a fever or persistent symptoms for more than 2 3 days.  You have a fever and your symptoms suddenly get worse.  You have a bad smell coming from the wound or dressing.  MAKE SURE YOU:   Understand these instructions.  Will watch your condition.  Will get help right away if you are not doing well or get worse. Document Released: 03/15/2005 Document Revised: 05/22/2012 Document Reviewed: 05/09/2011 [facility] Patient Information 2014 [place], [place].

## 2013-11-03 NOTE — ED Notes (Signed)
Pt returned from CT at this time.  

## 2013-11-03 NOTE — ED Notes (Signed)
Cspine clinically cleared byPA

## 2013-11-03 NOTE — ED Provider Notes (Signed)
Medical screening examination/treatment/procedure(s) were performed by non-physician practitioner and as supervising physician I was immediately available for consultation/collaboration.   EKG Interpretation None        Courtney F Horton, MD 11/03/13 0553 

## 2013-11-03 NOTE — ED Notes (Signed)
PA attempting to place tooth back in place and suturing nose and mouth

## 2013-11-03 NOTE — ED Notes (Signed)
Pt to CT at this time.
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
## 2014-01-22 IMAGING — CR DG CHEST 2V
2 series · 2 of 2 positions shown · non-contrast
Comparison: None.

CLINICAL DATA: Left-sided chest pain.  Smoker.

CHEST - 2 VIEW

[w chest pa]
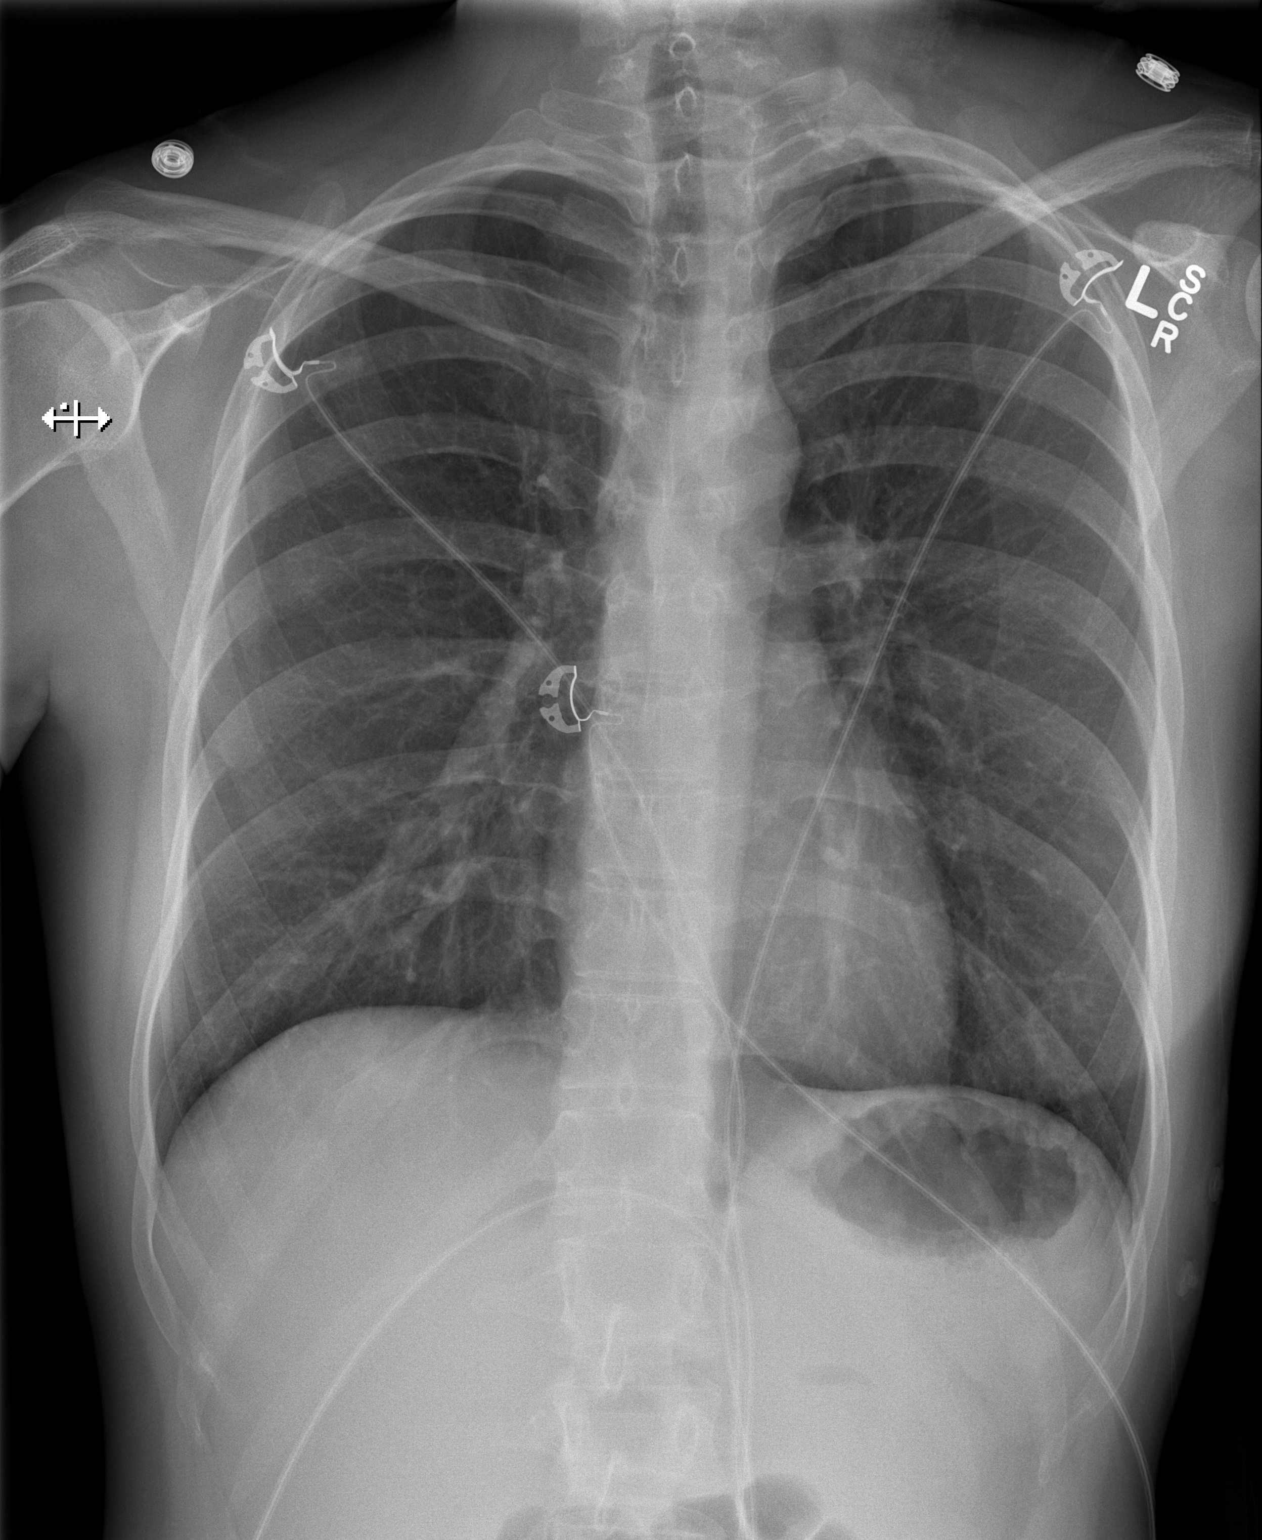

[w chest lat]
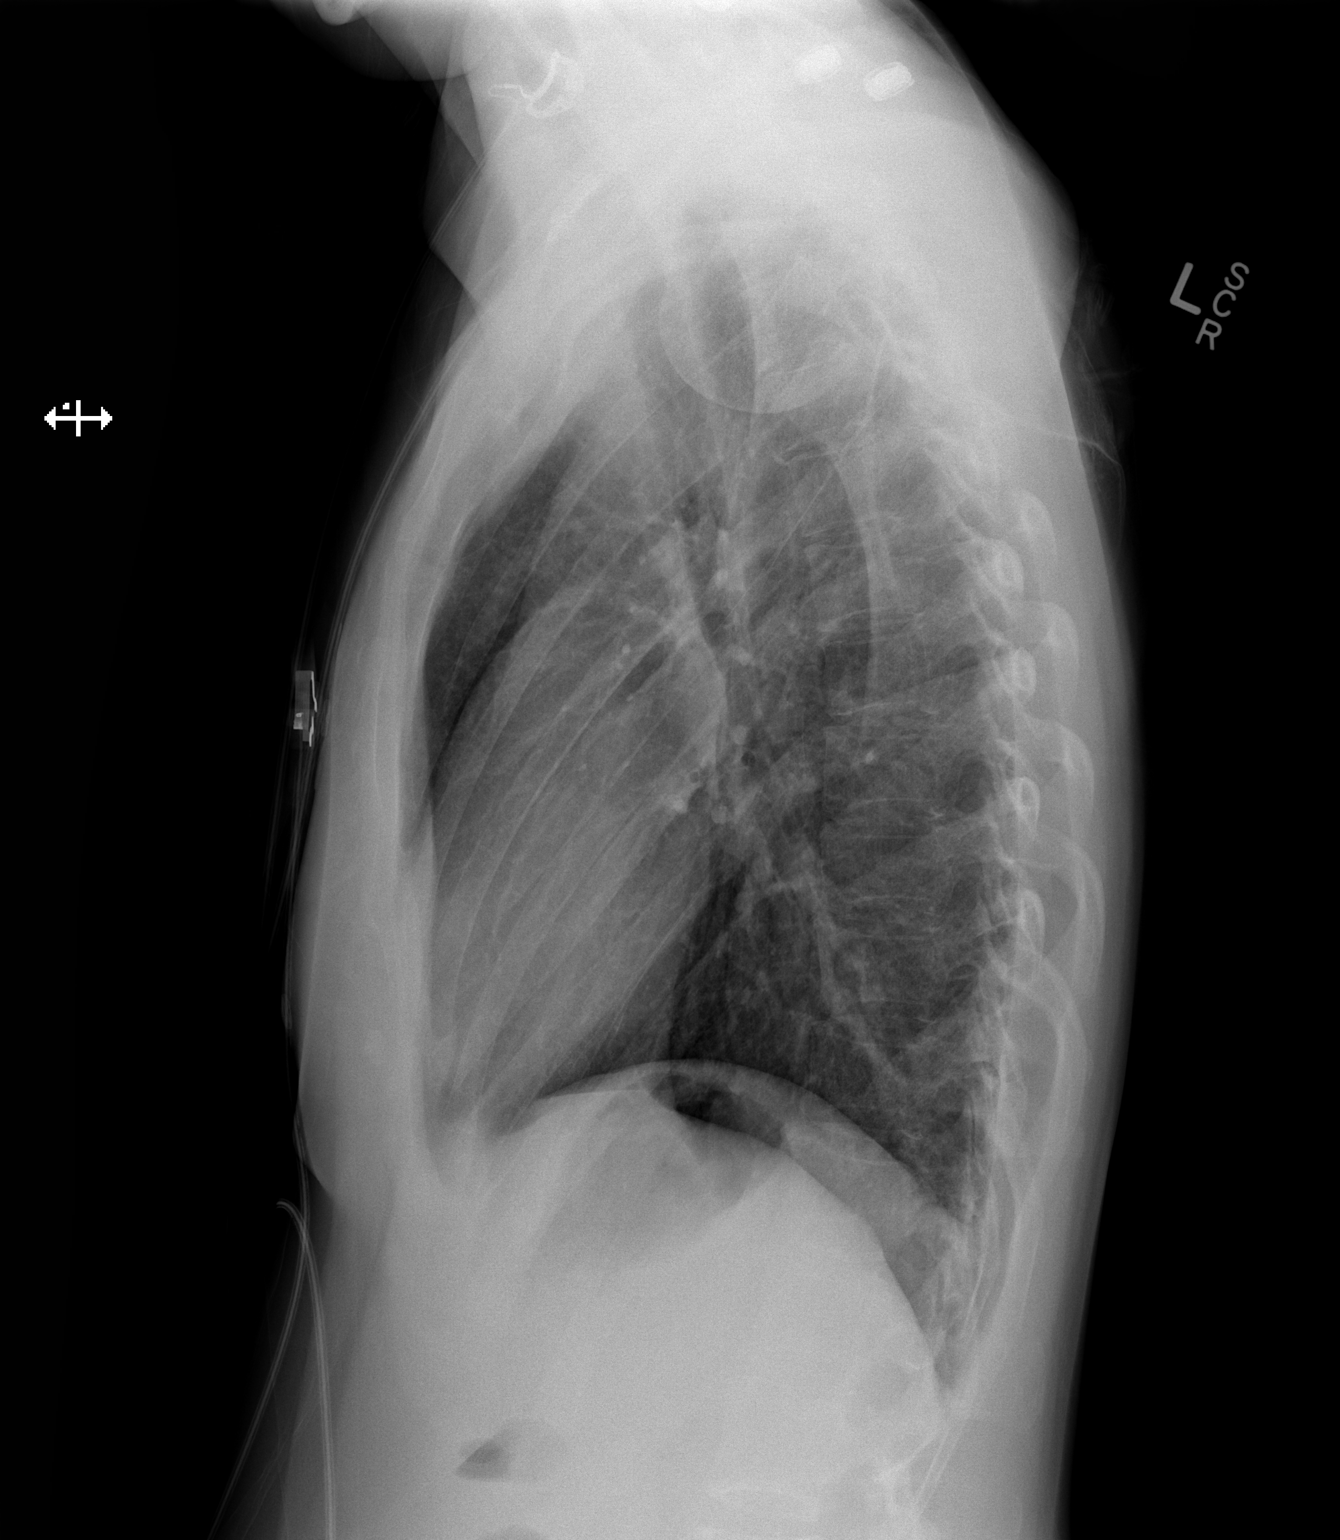

[2 of 2 positions shown; findings below may reference images not displayed]

FINDINGS: The heart size and mediastinal contours are within
normal limits.  Both lungs are clear.  No evidence of pneumothorax
or pleural effusion.  The visualized skeletal structures are
unremarkable.
IMPRESSION: No active cardiopulmonary disease.

## 2014-06-20 ENCOUNTER — Encounter (HOSPITAL_COMMUNITY): Payer: Self-pay | Admitting: Emergency Medicine

## 2014-06-20 ENCOUNTER — Emergency Department (HOSPITAL_COMMUNITY)
Admission: EM | Admit: 2014-06-20 | Discharge: 2014-06-20 | Disposition: A | Discharge status: Home / Self Care | Payer: Medicaid Other | Attending: Emergency Medicine | Admitting: Emergency Medicine

## 2014-06-20 DIAGNOSIS — Z792 Long term (current) use of antibiotics: Secondary | ICD-10-CM | POA: Diagnosis not present

## 2014-06-20 DIAGNOSIS — I10 Essential (primary) hypertension: Secondary | ICD-10-CM | POA: Diagnosis not present

## 2014-06-20 DIAGNOSIS — Z720 Tobacco use: Secondary | ICD-10-CM | POA: Insufficient documentation

## 2014-06-20 DIAGNOSIS — Z3202 Encounter for pregnancy test, result negative: Secondary | ICD-10-CM | POA: Insufficient documentation

## 2014-06-20 DIAGNOSIS — R1030 Lower abdominal pain, unspecified: Secondary | ICD-10-CM | POA: Diagnosis present

## 2014-06-20 DIAGNOSIS — R109 Unspecified abdominal pain: Secondary | ICD-10-CM

## 2014-06-20 HISTORY — DX: Essential (primary) hypertension: I10

## 2014-06-20 LAB — COMPREHENSIVE METABOLIC PANEL
ALK PHOS: 62 U/L (ref 39–117)
ALT: 31 U/L (ref 0–35)
ANION GAP: 5 (ref 5–15)
AST: 29 U/L (ref 0–37)
Albumin: 4.6 g/dL (ref 3.5–5.2)
BUN: 9 mg/dL (ref 6–23)
CO2: 25 mmol/L (ref 19–32)
Calcium: 8.9 mg/dL (ref 8.4–10.5)
Chloride: 105 mEq/L (ref 96–112)
Creatinine, Ser: 0.56 mg/dL (ref 0.50–1.10)
GFR calc non Af Amer: >90 mL/min (ref >90)
GLUCOSE: 90 mg/dL (ref 70–99)
POTASSIUM: 4.3 mmol/L (ref 3.5–5.1)
Sodium: 135 mmol/L (ref 135–145)
Total Bilirubin: 0.9 mg/dL (ref 0.3–1.2)
Total Protein: 7.4 g/dL (ref 6.0–8.3)

## 2014-06-20 LAB — URINALYSIS, ROUTINE W REFLEX MICROSCOPIC
BILIRUBIN URINE: NEGATIVE
Glucose, UA: NEGATIVE mg/dL
HGB URINE DIPSTICK: NEGATIVE
KETONES UR: NEGATIVE mg/dL
Leukocytes, UA: NEGATIVE
Nitrite: NEGATIVE
PROTEIN: NEGATIVE mg/dL
Specific Gravity, Urine: 1.023 (ref 1.005–1.030)
UROBILINOGEN UA: 0.2 mg/dL (ref 0.0–1.0)
pH: 6 (ref 5.0–8.0)

## 2014-06-20 LAB — CBC WITH DIFFERENTIAL/PLATELET
Basophils Absolute: 0 10*3/uL (ref 0.0–0.1)
Basophils Relative: 1 % (ref 0–1)
EOS PCT: 4 % (ref 0–5)
Eosinophils Absolute: 0.2 10*3/uL (ref 0.0–0.7)
HEMATOCRIT: 39.7 % (ref 36.0–46.0)
HEMOGLOBIN: 13.2 g/dL (ref 12.0–15.0)
LYMPHS ABS: 1.3 10*3/uL (ref 0.7–4.0)
Lymphocytes Relative: 24 % (ref 12–46)
MCH: 31.9 pg (ref 26.0–34.0)
MCHC: 33.2 g/dL (ref 30.0–36.0)
MCV: 95.9 fL (ref 78.0–100.0)
MONOS PCT: 14 % — AB (ref 3–12)
Monocytes Absolute: 0.7 10*3/uL (ref 0.1–1.0)
Neutro Abs: 3.1 10*3/uL (ref 1.7–7.7)
Neutrophils Relative %: 57 % (ref 43–77)
Platelets: 210 10*3/uL (ref 150–400)
RBC: 4.14 MIL/uL (ref 3.87–5.11)
RDW: 12.5 % (ref 11.5–15.5)
WBC: 5.4 10*3/uL (ref 4.0–10.5)

## 2014-06-20 LAB — LIPASE, BLOOD: Lipase: 46 U/L (ref 11–59)

## 2014-06-20 LAB — POC URINE PREG, ED: PREG TEST UR: NEGATIVE

## 2014-06-20 MED ORDER — HYDROCODONE-ACETAMINOPHEN 5-325 MG PO TABS: 1.0000 | ORAL_TABLET | Freq: Four times a day (QID) | ORAL | Status: DC | PRN

## 2014-06-20 MED ORDER — MORPHINE SULFATE 4 MG/ML IJ SOLN
4.0000 mg | Freq: Once | INTRAMUSCULAR | Status: AC
  Administered 2014-06-20: 4 mg via INTRAVENOUS
  Filled 2014-06-20: qty 1

## 2014-06-20 MED ORDER — ONDANSETRON HCL 4 MG/2ML IJ SOLN
4.0000 mg | Freq: Once | INTRAMUSCULAR | Status: AC
  Administered 2014-06-20: 4 mg via INTRAVENOUS
  Filled 2014-06-20: qty 2

## 2014-06-20 MED ORDER — PHENAZOPYRIDINE HCL 200 MG PO TABS: 200.0000 mg | ORAL_TABLET | Freq: Three times a day (TID) | ORAL | Status: DC

## 2014-06-20 MED ORDER — SODIUM CHLORIDE 0.9 % IV BOLUS (SEPSIS)
1000.0000 mL | Freq: Once | INTRAVENOUS | Status: AC
  Administered 2014-06-20: 1000 mL via INTRAVENOUS

## 2014-06-20 NOTE — ED Notes (Signed)
Bolus completed Patient up to bathroom  Will bladder scan again when she returns to room

## 2014-06-20 NOTE — ED Provider Notes (Signed)
CSN: [id number]     Arrival date & time 06/20/14  1619 History   First MD Initiated Contact with Patient 06/20/14 1809     Chief Complaint  Patient presents with  . Abdominal Pain     (Consider location/radiation/quality/duration/timing/severity/associated sxs/prior Treatment) HPI Comments: Patient with past medical history remarkable for hypertension presents emergency department with chief complaint of lower abdominal pain. Patient states that the pain started yesterday. She states that it is progressively worsened, now she feels uncomfortable in her entire abdomen. She states that she has a history of ovarian cysts, and states this feels similar, but is worse than normal. She denies any associated fevers, chills, nausea, vomiting, diarrhea, constipation, or vaginal discharge. She denies dysuria, but states that her lower abdomen is painful when she urinates. She states that feels like she was unable to completely empty her bladder. She denies any prior abdominal surgeries. She has tried taking some ibuprofen with minimal relief. Symptoms are aggravated with palpation and movement.  The history is provided by the patient. No language interpreter was used.    Past Medical History  Diagnosis Date  . Hypertension    Past Surgical History  Procedure Laterality Date  . Wisdom tooth extraction     No family history on file. History  Substance Use Topics  . Smoking status: Current Every Day Smoker -- 0.50 packs/day    Types: Cigarettes  . Smokeless tobacco: Not on file  . Alcohol Use: Yes   OB History    No data available     Review of Systems  Constitutional: Negative for fever and chills.  Respiratory: Negative for shortness of breath.   Cardiovascular: Negative for chest pain.  Gastrointestinal: Positive for abdominal pain. Negative for nausea, vomiting, diarrhea and constipation.  Genitourinary: Positive for difficulty urinating. Negative for dysuria.  All other systems  reviewed and are negative.     Allergies  Review of patient's allergies indicates no known allergies.  Home Medications   Prior to Admission medications   Medication Sig Start Date End Date Taking? Authorizing Provider  amoxicillin-clavulanate (AUGMENTIN) 875-125 MG per tablet Take 1 tablet by mouth 2 (two) times daily. One po bid x 7 days 11/03/13   Dahlia Client Muthersbaugh, PA-C  HYDROcodone-acetaminophen (NORCO/VICODIN) 5-325 MG per tablet Take 2 tablets by mouth every 4 (four) hours as needed. 11/03/13   Hannah Muthersbaugh, PA-C  ondansetron (ZOFRAN ODT) 8 MG disintegrating tablet  ODT q4 hours prn nausea 11/03/13   Hannah Muthersbaugh, PA-C   BP 140/82 mmHg  Pulse 83  Temp(Src) 98.8 F (37.1 C) (Oral)  Resp 16  Wt 135 lb (61.236 kg)  SpO2 100%  LMP 05/31/2014 Physical Exam  Constitutional: She is oriented to person, place, and time. She appears well-developed and well-nourished.  HENT:  Head: Normocephalic and atraumatic.  Eyes: Conjunctivae and EOM are normal. Pupils are equal, round, and reactive to light.  Neck: Normal range of motion. Neck supple.  Cardiovascular: Normal rate and regular rhythm.  Exam reveals no gallop and no friction rub.   No murmur heard. Pulmonary/Chest: Effort normal and breath sounds normal. No respiratory distress. She has no wheezes. She has no rales. She exhibits no tenderness.  Abdominal: Soft. Bowel sounds are normal. She exhibits no distension and no mass. There is tenderness. There is no rebound and no guarding.  Bilateral lower abdominal tenderness, no distention, no masses, no guarding  Musculoskeletal: Normal range of motion. She exhibits no edema or tenderness.  Neurological: She is alert and  oriented to person, place, and time.  Skin: Skin is warm and dry.  Psychiatric: She has a normal mood and affect. Her behavior is normal. Judgment and thought content normal.  Nursing note and vitals reviewed.   ED Course  Procedures (including  critical care time) Results for orders placed or performed during the hospital encounter of 06/20/14  CBC with Differential  Result Value Ref Range   WBC 5.4 4.0 - 10.5 K/uL   RBC 4.14 3.87 - 5.11 MIL/uL   Hemoglobin 13.2 12.0 - 15.0 g/dL   HCT 16.1 09.6 - 04.5 %   MCV 95.9 78.0 - 100.0 fL   MCH 31.9 26.0 - 34.0 pg   MCHC 33.2 30.0 - 36.0 g/dL   RDW 40.9 81.1 - 91.4 %   Platelets 210 150 - 400 K/uL   Neutrophils Relative % 57 43 - 77 %   Neutro Abs 3.1 1.7 - 7.7 K/uL   Lymphocytes Relative 24 12 - 46 %   Lymphs Abs 1.3 0.7 - 4.0 K/uL   Monocytes Relative 14 (H) 3 - 12 %   Monocytes Absolute 0.7 0.1 - 1.0 K/uL   Eosinophils Relative 4 0 - 5 %   Eosinophils Absolute 0.2 0.0 - 0.7 K/uL   Basophils Relative 1 0 - 1 %   Basophils Absolute 0.0 0.0 - 0.1 K/uL  Comprehensive metabolic panel  Result Value Ref Range   Sodium 135 135 - 145 mmol/L   Potassium 4.3 3.5 - 5.1 mmol/L   Chloride 105 96 - 112 mEq/L   CO2 25 19 - 32 mmol/L   Glucose, Bld 90 70 - 99 mg/dL   BUN 9 6 - 23 mg/dL   Creatinine, Ser 7.82 0.50 - 1.10 mg/dL   Calcium 8.9 8.4 - 95.6 mg/dL   Total Protein 7.4 6.0 - 8.3 g/dL   Albumin 4.6 3.5 - 5.2 g/dL   AST 29 0 - 37 U/L   ALT 31 0 - 35 U/L   Alkaline Phosphatase 62 39 - 117 U/L   Total Bilirubin 0.9 0.3 - 1.2 mg/dL   GFR calc non Af Amer >90 >90 mL/min   GFR calc Af Amer >90 >90 mL/min   Anion gap 5 5 - 15  Lipase, blood  Result Value Ref Range   Lipase 46 11 - 59 U/L  Urinalysis, Routine w reflex microscopic  Result Value Ref Range   Color, Urine YELLOW YELLOW   APPearance CLEAR CLEAR   Specific Gravity, Urine 1.023 1.005 - 1.030   pH 6.0 5.0 - 8.0   Glucose, UA NEGATIVE NEGATIVE mg/dL   Hgb urine dipstick NEGATIVE NEGATIVE   Bilirubin Urine NEGATIVE NEGATIVE   Ketones, ur NEGATIVE NEGATIVE mg/dL   Protein, ur NEGATIVE NEGATIVE mg/dL   Urobilinogen, UA 0.2 0.0 - 1.0 mg/dL   Nitrite NEGATIVE NEGATIVE   Leukocytes, UA NEGATIVE NEGATIVE  POC Urine  Pregnancy, ED  (If Pre-menopausal female) - do not order at [facility]  Result Value Ref Range   Preg Test, Ur NEGATIVE NEGATIVE   No results found.    EKG Interpretation None      MDM   Final diagnoses:  Abdominal pain, unspecified abdominal location    Patient with lower abdominal pain and feeling of urinary retention. Will check labs, urinalysis, and bladder scan. Patient does have some focal lower abdominal tenderness.  Labs are unremarkable. No post void residual. Abdomen is soft and nontender. At this time, I feel like the patient is appropriate  for outpatient follow-up. Specific return precautions given. Patient understands agrees with the plan.  Discussed with Dr. Patria Mane, who recommends urology follow-up.  Patient instructed to return for:  New or worsening symptoms, including, increased abdominal pain, especially pain that localizes to one side, bloody vomit, bloody diarrhea, fever >101, and intractable vomiting.     Roxy Horseman, PA-C 06/20/14 2106  Lyanne Co, MD 06/21/14 [phone]

## 2014-06-20 NOTE — ED Notes (Signed)
Pt states her mother will give her a ride home.

## 2014-06-20 NOTE — ED Notes (Addendum)
300-450 urine in bladder scan Rob,PA notified

## 2014-06-20 NOTE — ED Notes (Signed)
PVR 15 ml via bladder scan Will make EDP aware

## 2014-06-20 NOTE — Discharge Instructions (Signed)

## 2014-06-20 NOTE — ED Notes (Signed)
Pt from home c/o  Lower abdominal pain since yesterday. This a.m she felt a strong urge to urinate but was unable to fully empty her bladder. Denies dysuria but c/o lower abdominal pain when she voids.

## 2014-07-13 ENCOUNTER — Encounter (HOSPITAL_COMMUNITY): Payer: Self-pay | Admitting: Emergency Medicine

## 2015-02-18 ENCOUNTER — Emergency Department (HOSPITAL_COMMUNITY)
Admission: EM | Admit: 2015-02-18 | Discharge: 2015-02-18 | Disposition: A | Discharge status: Home / Self Care | Payer: Medicaid Other | Attending: Emergency Medicine | Admitting: Emergency Medicine

## 2015-02-18 ENCOUNTER — Encounter (HOSPITAL_COMMUNITY): Payer: Self-pay | Admitting: Emergency Medicine

## 2015-02-18 DIAGNOSIS — J029 Acute pharyngitis, unspecified: Secondary | ICD-10-CM | POA: Insufficient documentation

## 2015-02-18 DIAGNOSIS — Z79899 Other long term (current) drug therapy: Secondary | ICD-10-CM | POA: Insufficient documentation

## 2015-02-18 DIAGNOSIS — Z720 Tobacco use: Secondary | ICD-10-CM | POA: Insufficient documentation

## 2015-02-18 DIAGNOSIS — Z791 Long term (current) use of non-steroidal anti-inflammatories (NSAID): Secondary | ICD-10-CM | POA: Insufficient documentation

## 2015-02-18 DIAGNOSIS — I10 Essential (primary) hypertension: Secondary | ICD-10-CM | POA: Insufficient documentation

## 2015-02-18 LAB — RAPID STREP SCREEN (MED CTR MEBANE ONLY): Streptococcus, Group A Screen (Direct): NEGATIVE

## 2015-02-18 MED ORDER — DEXAMETHASONE SODIUM PHOSPHATE 10 MG/ML IJ SOLN
10.0000 mg | Freq: Once | INTRAMUSCULAR | Status: AC
  Administered 2015-02-18: 10 mg via INTRAMUSCULAR
  Filled 2015-02-18: qty 1

## 2015-02-18 NOTE — Discharge Instructions (Signed)

## 2015-02-18 NOTE — Progress Notes (Signed)
pcp is [facility] [address] [place], [place] [address] [phone]

## 2015-02-18 NOTE — ED Notes (Signed)
Pt reports swelling and pain in r/side of throat. Stated that she has difficulty swallowing x 4 days. Also c/o body aches

## 2015-02-18 NOTE — ED Provider Notes (Signed)
CSN: [id number]     Arrival date & time 02/18/15  1258 History  This chart was scribed for non-physician practitioner Teressa Lower, PA-C working with Rolland Porter, MD by Murriel Hopper, ED Scribe. This patient was seen in room WTR6/WTR6 and the patient's care was started at 1:07 PM.  Chief Complaint  Patient presents with  . Sore Throat    painful to swallow x 4 days      The history is provided by the patient. No language interpreter was used.     HPI Comments: Mckenzie Williams is a 29 y.o. female who presents to the Emergency Department complaining of a constant, worsening right-sided sore throat that has been present for 4 days. Pt reports difficulty swallowing, rhinorrhea, generalized body aching, and coughing at night as well. Pt states she has taken ibuprofen for her symptoms with slight relief.    Past Medical History  Diagnosis Date  . Hypertension    Past Surgical History  Procedure Laterality Date  . Wisdom tooth extraction     No family history on file. Social History  Substance Use Topics  . Smoking status: Current Every Day Smoker -- 0.50 packs/day for 4 years    Types: Cigarettes  . Smokeless tobacco: Not on file  . Alcohol Use: Yes     Comment: 2-3 drinks/ day   OB History    Gravida Para Term Preterm AB TAB SAB Ectopic Multiple Living   0 0 0 0 0 0 0 0       Review of Systems  Constitutional: Negative for fever and chills.  HENT: Positive for rhinorrhea, sore throat and trouble swallowing.   Respiratory: Positive for cough.   All other systems reviewed and are negative.     Allergies  Review of patient's allergies indicates no known allergies.  Home Medications   Prior to Admission medications   Medication Sig Start Date End Date Taking? Authorizing Provider  ALPRAZolam Prudy Feeler) 0.5 MG tablet Take 0.25 mg by mouth daily as needed. Anxiety    Historical Provider, MD  amoxicillin-clavulanate (AUGMENTIN) 875-125 MG per tablet Take 1 tablet by mouth 2  (two) times daily. One po bid x 7 days Patient not taking: Reported on 06/20/2014 11/03/13   Dahlia Client Muthersbaugh, PA-C  amphetamine-dextroamphetamine (ADDERALL) 15 MG tablet Take 15 mg by mouth daily.    Historical Provider, MD  aspirin 81 MG tablet Take 81 mg by mouth daily.    Historical Provider, MD  HYDROcodone-acetaminophen (NORCO/VICODIN) 5-325 MG per tablet Take 1-2 tablets by mouth every 6 (six) hours as needed. 06/20/14   Roxy Horseman, PA-C  ibuprofen (ADVIL,MOTRIN) 200 MG tablet Take 200 mg by mouth every 6 (six) hours as needed. Pain    Historical Provider, MD  naproxen (NAPROSYN) 500 MG tablet Take 1 tablet (500 mg total) by mouth 2 (two) times daily with a meal. 06/18/12   Hannah Muthersbaugh, PA-C  ondansetron (ZOFRAN ODT) 8 MG disintegrating tablet 8mg  ODT q4 hours prn nausea Patient not taking: Reported on 06/20/2014 11/03/13   Dahlia Client Muthersbaugh, PA-C  oxyCODONE-acetaminophen (PERCOCET) 10-650 MG per tablet Take 1 tablet by mouth every 6 (six) hours as needed for pain. 06/18/12   Hannah Muthersbaugh, PA-C  phenazopyridine (PYRIDIUM) 200 MG tablet Take 1 tablet (200 mg total) by mouth 3 (three) times daily. 06/20/14   Roxy Horseman, PA-C   BP 123/85 mmHg  Pulse 96  Temp(Src) 98.3 F (36.8 C) (Oral)  Resp 18  Wt 135 lb (61.236 kg)  SpO2  98% Physical Exam  Constitutional: She is oriented to person, place, and time. She appears well-developed and well-nourished.  HENT:  Head: Normocephalic and atraumatic.  Right Ear: External ear normal.  Left Ear: External ear normal.  Mouth/Throat: Posterior oropharyngeal edema and posterior oropharyngeal erythema present.  Cardiovascular: Normal rate.   Pulmonary/Chest: Effort normal.  Abdominal: She exhibits no distension.  Neurological: She is alert and oriented to person, place, and time.  Skin: Skin is warm and dry.  Psychiatric: She has a normal mood and affect.  Nursing note and vitals reviewed.   ED Course  Procedures (including  critical care time)  DIAGNOSTIC STUDIES: Oxygen Saturation is 98% on room air, normal by my interpretation.    COORDINATION OF CARE: 1:08 PM Discussed treatment plan with pt at bedside and pt agreed to plan.   Labs Review Labs Reviewed  RAPID STREP SCREEN (NOT AT [facility])  CULTURE, GROUP A STREP    Imaging Review No results found. I have personally reviewed and evaluated these images and lab results as part of my medical decision-making.   EKG Interpretation None      MDM   Final diagnoses:  Pharyngitis    Strep negative. Pt given decadron for comfort relief. Considered mono although doubt at this time. No sign of PTA  I personally performed the services described in this documentation, which was scribed in my presence. The recorded information has been reviewed and is accurate.    Teressa Lower, NP 02/18/15 1400  Rolland Porter, MD 02/21/15 [phone]

## 2015-02-21 LAB — CULTURE, GROUP A STREP

## 2017-01-17 ENCOUNTER — Emergency Department (HOSPITAL_BASED_OUTPATIENT_CLINIC_OR_DEPARTMENT_OTHER): Payer: Self-pay

## 2017-01-17 ENCOUNTER — Emergency Department (HOSPITAL_BASED_OUTPATIENT_CLINIC_OR_DEPARTMENT_OTHER)
Admission: EM | Admit: 2017-01-17 | Discharge: 2017-01-18 | Disposition: A | Discharge status: Home / Self Care | Payer: Self-pay | Attending: Physician Assistant | Admitting: Physician Assistant

## 2017-01-17 ENCOUNTER — Encounter (HOSPITAL_BASED_OUTPATIENT_CLINIC_OR_DEPARTMENT_OTHER): Payer: Self-pay | Admitting: Emergency Medicine

## 2017-01-17 DIAGNOSIS — B9689 Other specified bacterial agents as the cause of diseases classified elsewhere: Secondary | ICD-10-CM

## 2017-01-17 DIAGNOSIS — I10 Essential (primary) hypertension: Secondary | ICD-10-CM | POA: Insufficient documentation

## 2017-01-17 DIAGNOSIS — Z79899 Other long term (current) drug therapy: Secondary | ICD-10-CM | POA: Insufficient documentation

## 2017-01-17 DIAGNOSIS — F17210 Nicotine dependence, cigarettes, uncomplicated: Secondary | ICD-10-CM | POA: Insufficient documentation

## 2017-01-17 DIAGNOSIS — N760 Acute vaginitis: Secondary | ICD-10-CM | POA: Insufficient documentation

## 2017-01-17 LAB — COMPREHENSIVE METABOLIC PANEL
ALBUMIN: 4.6 g/dL (ref 3.5–5.0)
ALT: 34 U/L (ref 14–54)
AST: 32 U/L (ref 15–41)
Alkaline Phosphatase: 57 U/L (ref 38–126)
Anion gap: 10 (ref 5–15)
BUN: 8 mg/dL (ref 6–20)
CHLORIDE: 105 mmol/L (ref 101–111)
CO2: 24 mmol/L (ref 22–32)
Calcium: 9.3 mg/dL (ref 8.9–10.3)
Creatinine, Ser: 0.58 mg/dL (ref 0.44–1.00)
GFR calc Af Amer: >60 mL/min (ref >60)
GFR calc non Af Amer: >60 mL/min (ref >60)
GLUCOSE: 113 mg/dL — AB (ref 65–99)
Potassium: 3.7 mmol/L (ref 3.5–5.1)
Sodium: 139 mmol/L (ref 135–145)
Total Bilirubin: 1 mg/dL (ref 0.3–1.2)
Total Protein: 7.2 g/dL (ref 6.5–8.1)

## 2017-01-17 LAB — HCG, QUANTITATIVE, PREGNANCY

## 2017-01-17 LAB — CBC
HCT: 41.9 % (ref 36.0–46.0)
Hemoglobin: 14.7 g/dL (ref 12.0–15.0)
MCH: 32.5 pg (ref 26.0–34.0)
MCHC: 35.1 g/dL (ref 30.0–36.0)
MCV: 92.5 fL (ref 78.0–100.0)
Platelets: 196 10*3/uL (ref 150–400)
RBC: 4.53 MIL/uL (ref 3.87–5.11)
RDW: 11.8 % (ref 11.5–15.5)
WBC: 6.9 10*3/uL (ref 4.0–10.5)

## 2017-01-17 LAB — URINALYSIS, ROUTINE W REFLEX MICROSCOPIC
Bilirubin Urine: NEGATIVE
GLUCOSE, UA: NEGATIVE mg/dL
Hgb urine dipstick: NEGATIVE
KETONES UR: 40 mg/dL — AB
LEUKOCYTES UA: NEGATIVE
Nitrite: NEGATIVE
PH: 6 (ref 5.0–8.0)
Protein, ur: NEGATIVE mg/dL
Specific Gravity, Urine: >1.046 — ABNORMAL HIGH (ref 1.005–1.030)

## 2017-01-17 LAB — WET PREP, GENITAL
SPERM: NONE SEEN
TRICH WET PREP: NONE SEEN
YEAST WET PREP: NONE SEEN

## 2017-01-17 LAB — LIPASE, BLOOD: Lipase: 38 U/L (ref 11–51)

## 2017-01-17 MED ORDER — MORPHINE SULFATE (PF) 4 MG/ML IV SOLN
4.0000 mg | Freq: Once | INTRAVENOUS | Status: AC
  Administered 2017-01-17: 4 mg via INTRAVENOUS
  Filled 2017-01-17: qty 1

## 2017-01-17 MED ORDER — METRONIDAZOLE 500 MG PO TABS: 500.0000 mg | ORAL_TABLET | Freq: Two times a day (BID) | ORAL | 0 refills | Status: DC

## 2017-01-17 MED ORDER — ONDANSETRON HCL 4 MG/2ML IJ SOLN
4.0000 mg | Freq: Once | INTRAMUSCULAR | Status: AC
  Administered 2017-01-17: 4 mg via INTRAVENOUS
  Filled 2017-01-17: qty 2

## 2017-01-17 MED ORDER — IOPAMIDOL (ISOVUE-300) INJECTION 61%
100.0000 mL | Freq: Once | INTRAVENOUS | Status: AC | PRN
  Administered 2017-01-17: 100 mL via INTRAVENOUS

## 2017-01-17 MED ORDER — OXYCODONE-ACETAMINOPHEN 5-325 MG PO TABS
1.0000 | ORAL_TABLET | Freq: Once | ORAL | Status: AC
  Administered 2017-01-18: 1 via ORAL
  Filled 2017-01-17: qty 1

## 2017-01-17 MED ORDER — SODIUM CHLORIDE 0.9 % IV BOLUS (SEPSIS)
1000.0000 mL | Freq: Once | INTRAVENOUS | Status: AC
  Administered 2017-01-17: 1000 mL via INTRAVENOUS

## 2017-01-17 MED ORDER — METRONIDAZOLE 500 MG PO TABS
500.0000 mg | ORAL_TABLET | Freq: Once | ORAL | Status: AC
  Administered 2017-01-18: 500 mg via ORAL
  Filled 2017-01-17: qty 1

## 2017-01-17 NOTE — Discharge Instructions (Signed)
Please return with any concerns. °

## 2017-01-17 NOTE — ED Triage Notes (Signed)
RLQ abd pain since 430 this morning with associated nausea. Denies urinary symptoms.

## 2017-01-17 NOTE — ED Notes (Signed)
ED Provider at bedside. 

## 2017-01-17 NOTE — ED Notes (Signed)
Pt attempting urine sample 

## 2017-01-17 NOTE — ED Provider Notes (Signed)
[facility] Provider Note   CSN: [id number] Arrival date & time: 01/17/17  1626     History   Chief Complaint Chief Complaint  Patient presents with  . Abdominal Pain    HPI Mckenzie Williams is a 31 y.o. female with past medical history of HTN who presents to the emergency department for RLQ abdominal pain with assoicated nausea since this morning. The patient is accompanied by mother. Patient was awoken at 430am this morning due to pain in the RLQ, that she describes as "intesnse". The pain does not radiate. The pain is associated with constant nausea without any emesis. Also associated decreased appetite. Patient has only had 2 saltine crackers today around 1030am, which she states is not typical for her. The patient states the pain has grown in intensity from 6/10 this morning to now 9/10. Pain is worsened with walking, movement of the right leg and palpation. No previous abdominal surgeries. LBM this morning and normal. LMP 1.5 weeks ago and normal. Does not usually have midcycle abdominal pain. No fever, chills, dysuria, flank pain, frequency, urgency, hematuria, hematochezia, melena, diarrhea, constipation, vaginal pain, vaginal discharge, vaginal bleeding. Sexually active with 1 partner in the last year, uses protection, no history of STI in partner or self.   HPI  Past Medical History:  Diagnosis Date  . Hypertension     There are no active problems to display for this patient.   Past Surgical History:  Procedure Laterality Date  . WISDOM TOOTH EXTRACTION      OB History    Gravida Para Term Preterm AB Living   0 0 0 0 0     SAB TAB Ectopic Multiple Live Births   0 0 0           Home Medications    Prior to Admission medications   Medication Sig Start Date End Date Taking? Authorizing Provider  ALPRAZolam Prudy Feeler) 0.5 MG tablet Take 0.25 mg by mouth daily as needed. Anxiety    [provider]  amoxicillin-clavulanate (AUGMENTIN) 875-125 MG per  tablet Take 1 tablet by mouth 2 (two) times daily. One po bid x 7 days Patient not taking: Reported on 06/20/2014 11/03/13   Muthersbaugh, Dahlia Client, PA-C  amphetamine-dextroamphetamine (ADDERALL) 15 MG tablet Take 15 mg by mouth daily.    [provider]  aspirin 81 MG tablet Take 81 mg by mouth daily.    [provider]  HYDROcodone-acetaminophen (NORCO/VICODIN) 5-325 MG per tablet Take 1-2 tablets by mouth every 6 (six) hours as needed. 06/20/14   Roxy Horseman, PA-C  ibuprofen (ADVIL,MOTRIN) 200 MG tablet Take 200 mg by mouth every 6 (six) hours as needed. Pain    [provider]  metroNIDAZOLE (FLAGYL) 500 MG tablet Take 1 tablet (500 mg total) by mouth 2 (two) times daily. 01/17/17   Mackuen, Courteney Lyn, MD  naproxen (NAPROSYN) 500 MG tablet Take 1 tablet (500 mg total) by mouth 2 (two) times daily with a meal. 06/18/12   Muthersbaugh, Dahlia Client, PA-C  ondansetron (ZOFRAN ODT) 8 MG disintegrating tablet 8mg  ODT q4 hours prn nausea Patient not taking: Reported on 06/20/2014 11/03/13   Muthersbaugh, Dahlia Client, PA-C  oxyCODONE-acetaminophen (PERCOCET) 10-650 MG per tablet Take 1 tablet by mouth every 6 (six) hours as needed for pain. 06/18/12   Muthersbaugh, Dahlia Client, PA-C  phenazopyridine (PYRIDIUM) 200 MG tablet Take 1 tablet (200 mg total) by mouth 3 (three) times daily. 06/20/14   Roxy Horseman, PA-C    Family History Family  History  Problem Relation Age of Onset  . Hypertension Father     Social History Social History  Substance Use Topics  . Smoking status: Current Every Day Smoker    Packs/day: 0.50    Years: 4.00    Types: Cigarettes  . Smokeless tobacco: Never Used  . Alcohol use Yes     Comment: 2-3 drinks/ day     Allergies   Patient has no known allergies.   Review of Systems Review of Systems  All other systems reviewed and are negative.    Physical Exam Updated Vital Signs BP (!) 131/91 (BP Location: Right Arm)   Pulse 83   Temp 98.6 F (37  C) (Oral)   Resp 16   Ht 5\' 8"  (1.727 m)   Wt 61.2 kg (135 lb)   LMP 01/10/2017   SpO2 99%   BMI 20.53 kg/m   Physical Exam  Constitutional: She appears well-developed and well-nourished.  Non-toxic appearing  HENT:  Head: Normocephalic and atraumatic.  Right Ear: External ear normal.  Left Ear: External ear normal.  Nose: Nose normal.  Mouth/Throat: Oropharynx is clear and moist.  Eyes: Pupils are equal, round, and reactive to light. Right eye exhibits no discharge. Left eye exhibits no discharge. No scleral icterus.  Neck: Neck supple.  Cardiovascular: Regular rhythm and intact distal pulses.  Tachycardia present.   No murmur heard. Pulses:      Radial pulses are 2+ on the right side, and 2+ on the left side.       Dorsalis pedis pulses are 2+ on the right side, and 2+ on the left side.       Posterior tibial pulses are 2+ on the right side, and 2+ on the left side.  No lower extremity swelling or edema. Calves symmetric in size bilaterally.  Pulmonary/Chest: Effort normal and breath sounds normal. She exhibits no tenderness.  Abdominal: Soft. Normal appearance and bowel sounds are normal. She exhibits no distension and no mass. There is tenderness in the right lower quadrant and suprapubic area. There is tenderness at McBurney's point. There is no rigidity, no rebound, no guarding and no CVA tenderness.  Positive Psoas sign. Negative Rovsing and obturator.  Musculoskeletal: She exhibits no edema.  Lymphadenopathy:    She has no cervical adenopathy.  Neurological: She is alert.  Skin: Skin is warm. No rash noted.  Psychiatric: She has a normal mood and affect.  Nursing note and vitals reviewed.    ED Treatments / Results  Labs (all labs ordered are listed, but only abnormal results are displayed) Labs Reviewed  WET PREP, GENITAL - Abnormal; Notable for the following:       Result Value   Clue Cells Wet Prep HPF POC PRESENT (*)    WBC, Wet Prep HPF POC FEW (*)    All  other components within normal limits  COMPREHENSIVE METABOLIC PANEL - Abnormal; Notable for the following:    Glucose, Bld 113 (*)    All other components within normal limits  URINALYSIS, ROUTINE W REFLEX MICROSCOPIC - Abnormal; Notable for the following:    Specific Gravity, Urine >1.046 (*)    Ketones, ur 40 (*)    All other components within normal limits  LIPASE, BLOOD  CBC  HCG, QUANTITATIVE, PREGNANCY  GC/CHLAMYDIA PROBE AMP ([place]) NOT AT [facility]    EKG  EKG Interpretation None       Radiology [place] Transvaginal Non-ob  Result Date: 01/17/2017 CLINICAL DATA:  31 year old female  with right lower quadrant abdominal pain. EXAM: TRANSABDOMINAL AND TRANSVAGINAL ULTRASOUND OF PELVIS DOPPLER ULTRASOUND OF OVARIES TECHNIQUE: Both transabdominal and transvaginal ultrasound examinations of the pelvis were performed. Transabdominal technique was performed for global imaging of the pelvis including uterus, ovaries, adnexal regions, and pelvic cul-de-sac. It was necessary to proceed with endovaginal exam following the transabdominal exam to visualize the endometrium and ovaries. Color and duplex Doppler ultrasound was utilized to evaluate blood flow to the ovaries. COMPARISON:  Abdominal CT dated 01/17/2017 FINDINGS: Uterus Measurements: 7.9 x 3.8 x 4.8 cm. The uterus is retroverted. No fibroids or other mass visualized. Endometrium Thickness: 11 mm.  No focal abnormality visualized. Right ovary Measurements: 3.8 x 1.9 x 2.1 cm. Normal appearance/no adnexal mass. Left ovary Measurements: 2.6 x 3.3 x 2.1 cm. Normal appearance/no adnexal mass. Pulsed Doppler evaluation of both ovaries demonstrates normal low-resistance arterial and venous waveforms. Other findings No abnormal free fluid. IMPRESSION: Unremarkable pelvic ultrasound. Doppler detected arterial and venous flow to both ovaries. Electronically Signed   By: Elgie Collard M.D.   On: 01/17/2017 21:06   US Pelvis Complete  Result Date:  01/17/2017 CLINICAL DATA:  31 year old female with right lower quadrant abdominal pain. EXAM: TRANSABDOMINAL AND TRANSVAGINAL ULTRASOUND OF PELVIS DOPPLER ULTRASOUND OF OVARIES TECHNIQUE: Both transabdominal and transvaginal ultrasound examinations of the pelvis were performed. Transabdominal technique was performed for global imaging of the pelvis including uterus, ovaries, adnexal regions, and pelvic cul-de-sac. It was necessary to proceed with endovaginal exam following the transabdominal exam to visualize the endometrium and ovaries. Color and duplex Doppler ultrasound was utilized to evaluate blood flow to the ovaries. COMPARISON:  Abdominal CT dated 01/17/2017 FINDINGS: Uterus Measurements: 7.9 x 3.8 x 4.8 cm. The uterus is retroverted. No fibroids or other mass visualized. Endometrium Thickness: 11 mm.  No focal abnormality visualized. Right ovary Measurements: 3.8 x 1.9 x 2.1 cm. Normal appearance/no adnexal mass. Left ovary Measurements: 2.6 x 3.3 x 2.1 cm. Normal appearance/no adnexal mass. Pulsed Doppler evaluation of both ovaries demonstrates normal low-resistance arterial and venous waveforms. Other findings No abnormal free fluid. IMPRESSION: Unremarkable pelvic ultrasound. Doppler detected arterial and venous flow to both ovaries. Electronically Signed   By: Elgie Collard M.D.   On: 01/17/2017 21:06   Ct Abdomen Pelvis W Contrast  Result Date: 01/17/2017 CLINICAL DATA:  Right lower quadrant abdominal pain beginning this morning. EXAM: CT ABDOMEN AND PELVIS WITH CONTRAST TECHNIQUE: Multidetector CT imaging of the abdomen and pelvis was performed using the standard protocol following bolus administration of intravenous contrast. CONTRAST:  ISOVUE-300 IOPAMIDOL (ISOVUE-300) INJECTION 61% COMPARISON:  None. FINDINGS: Lower chest: The visualized lung bases are clear. The heart is normal in size. Hepatobiliary: An 11 mm low-density lesion in the posterior right hepatic lobe is compatible with a  cyst. The gallbladder is unremarkable. There is no biliary dilatation. Pancreas: Unremarkable. Spleen: Unremarkable. Adrenals/Urinary Tract: Unremarkable adrenal glands. No evidence of renal mass, calculi, or hydronephrosis. Unremarkable bladder. Stomach/Bowel: The stomach is within normal limits. There is no evidence of bowel obstruction. The appendix is unremarkable. Vascular/Lymphatic: Normal caliber of the abdominal aorta. No enlarged lymph nodes. Reproductive: Small volume fluid in the endometrial canal. Grossly unremarkable ovaries, though with evaluation on the right mildly limited by unopacified small bowel loops. Other: Small volume free fluid in the pelvis, likely physiologic. No abdominal wall mass or hernia. Musculoskeletal: No acute osseous abnormality or suspicious osseous lesion. IMPRESSION: No acute abnormality identified in the abdomen or pelvis. Normal appendix. Electronically Signed  By: Sebastian Ache M.D.   On: 01/17/2017 19:27   [place] Art/ven Flow Abd Pelv Doppler  Result Date: 01/17/2017 CLINICAL DATA:  31 year old female with right lower quadrant abdominal pain. EXAM: TRANSABDOMINAL AND TRANSVAGINAL ULTRASOUND OF PELVIS DOPPLER ULTRASOUND OF OVARIES TECHNIQUE: Both transabdominal and transvaginal ultrasound examinations of the pelvis were performed. Transabdominal technique was performed for global imaging of the pelvis including uterus, ovaries, adnexal regions, and pelvic cul-de-sac. It was necessary to proceed with endovaginal exam following the transabdominal exam to visualize the endometrium and ovaries. Color and duplex Doppler ultrasound was utilized to evaluate blood flow to the ovaries. COMPARISON:  Abdominal CT dated 01/17/2017 FINDINGS: Uterus Measurements: 7.9 x 3.8 x 4.8 cm. The uterus is retroverted. No fibroids or other mass visualized. Endometrium Thickness: 11 mm.  No focal abnormality visualized. Right ovary Measurements: 3.8 x 1.9 x 2.1 cm. Normal appearance/no adnexal mass.  Left ovary Measurements: 2.6 x 3.3 x 2.1 cm. Normal appearance/no adnexal mass. Pulsed Doppler evaluation of both ovaries demonstrates normal low-resistance arterial and venous waveforms. Other findings No abnormal free fluid. IMPRESSION: Unremarkable pelvic ultrasound. Doppler detected arterial and venous flow to both ovaries. Electronically Signed   By: Elgie Collard M.D.   On: 01/17/2017 21:06    Procedures Procedures (including critical care time)  Medications Ordered in ED Medications  sodium chloride 0.9 % bolus 1,000 mL (0 mLs Intravenous Stopped 01/17/17 2026)  morphine 4 MG/ML injection 4 mg (4 mg Intravenous Given 01/17/17 1724)  ondansetron (ZOFRAN) injection 4 mg (4 mg Intravenous Given 01/17/17 1724)  iopamidol (ISOVUE-300) 61 % injection 100 mL (100 mLs Intravenous Contrast Given 01/17/17 1910)  morphine 4 MG/ML injection 4 mg (4 mg Intravenous Given 01/17/17 1957)  metroNIDAZOLE (FLAGYL) tablet 500 mg (500 mg Oral Given 01/18/17 0007)  oxyCODONE-acetaminophen (PERCOCET/ROXICET) 5-325 MG per tablet 1 tablet (1 tablet Oral Given 01/18/17 0007)     Initial Impression / Assessment and Plan / ED Course  I have reviewed the triage vital signs and the nursing notes.  Pertinent labs & imaging results that were available during my care of the patient were reviewed by me and considered in my medical decision making (see chart for details).     31 year old female with RLQ abdominal pain with associated nausea. Patient is tachycardic on presentation. Non-septic appearing and in no acute distress. Exam with RLQ abdominal pain over McBurney's point and positive psoas sign. No urinary or GYN complaints. Basic labs, pregnancy test, UA ordered. CT ordered to rule out appendicitis. Patient given 1L of fluid, pain medication, and nausea medication.   Pregnancy test negative. CT negative for appendicitis or other acute intra-abdominal pathology. CMP reassuring. CBC without leukocytosis and otherwise  unremarkable. Urinalysis without UTI. Lipase negative. Transvaginal ultrasound and pelvic ultrasound with Doppler flow ordered in order to rule out torsion. Pelvic exam refused by patient with request of female provider. Dr. Juliann Pares performed and notes white discharge with right adenexal tenderness. No CMT.  Ultrasound unremarkable with Doppler detected arterial and venous for both ovaries. Wet prep with evidence of bacterial vaginosis. Patient treated in the emergency department. The evaluation does not show pathology that would require ongoing emergent intervention or inpatient treatment. I advised the patient to follow-up with PCP this week. I advised the patient to return to the emergency department with new or worsening symptoms or new concerns. Specific return precautions discussed. The patient verbalized understanding and agreement with plan. All questions answered. No further questions at this time.Pain controlled in  the emergency department. The patient is hemodynamically stable, mentating appropriately and appears safe for discharge.  Patient case seen and discussed with Dr. Corlis LeakMackuen who is in agreement with plan.   Final Clinical Impressions(s) / ED Diagnoses   Final diagnoses:  Bacterial vaginosis    New Prescriptions Discharge Medication List as of 01/17/2017 11:57 PM    START taking these medications   Details  metroNIDAZOLE (FLAGYL) 500 MG tablet Take 1 tablet (500 mg total) by mouth 2 (two) times daily., Starting Wed 01/17/2017, Print         Carnelius Hammitt, Elmer SowMichael M, PA-C 01/18/17 0215    Jacinto HalimMaczis, Aamani Moose M, PA-C 01/18/17 0216    Abelino DerrickMackuen, Courteney Lyn, MD 01/19/17 0001

## 2017-01-19 LAB — GC/CHLAMYDIA PROBE AMP (~~LOC~~) NOT AT ARMC
CHLAMYDIA, DNA PROBE: NEGATIVE
Neisseria Gonorrhea: NEGATIVE

## 2017-09-19 ENCOUNTER — Encounter (HOSPITAL_BASED_OUTPATIENT_CLINIC_OR_DEPARTMENT_OTHER): Payer: Self-pay

## 2017-09-19 ENCOUNTER — Emergency Department (HOSPITAL_BASED_OUTPATIENT_CLINIC_OR_DEPARTMENT_OTHER)
Admission: EM | Admit: 2017-09-19 | Discharge: 2017-09-19 | Disposition: A | Discharge status: Home / Self Care | Payer: Self-pay | Attending: Emergency Medicine | Admitting: Emergency Medicine

## 2017-09-19 ENCOUNTER — Other Ambulatory Visit: Payer: Self-pay

## 2017-09-19 ENCOUNTER — Emergency Department (HOSPITAL_BASED_OUTPATIENT_CLINIC_OR_DEPARTMENT_OTHER): Payer: Self-pay

## 2017-09-19 DIAGNOSIS — Y929 Unspecified place or not applicable: Secondary | ICD-10-CM | POA: Insufficient documentation

## 2017-09-19 DIAGNOSIS — Y998 Other external cause status: Secondary | ICD-10-CM | POA: Insufficient documentation

## 2017-09-19 DIAGNOSIS — R030 Elevated blood-pressure reading, without diagnosis of hypertension: Secondary | ICD-10-CM

## 2017-09-19 DIAGNOSIS — I10 Essential (primary) hypertension: Secondary | ICD-10-CM | POA: Insufficient documentation

## 2017-09-19 DIAGNOSIS — F17210 Nicotine dependence, cigarettes, uncomplicated: Secondary | ICD-10-CM | POA: Insufficient documentation

## 2017-09-19 DIAGNOSIS — S93402A Sprain of unspecified ligament of left ankle, initial encounter: Secondary | ICD-10-CM | POA: Insufficient documentation

## 2017-09-19 DIAGNOSIS — R2242 Localized swelling, mass and lump, left lower limb: Secondary | ICD-10-CM | POA: Insufficient documentation

## 2017-09-19 DIAGNOSIS — X509XXA Other and unspecified overexertion or strenuous movements or postures, initial encounter: Secondary | ICD-10-CM | POA: Insufficient documentation

## 2017-09-19 DIAGNOSIS — W19XXXA Unspecified fall, initial encounter: Secondary | ICD-10-CM

## 2017-09-19 DIAGNOSIS — Y9301 Activity, walking, marching and hiking: Secondary | ICD-10-CM | POA: Insufficient documentation

## 2017-09-19 LAB — PREGNANCY, URINE: Preg Test, Ur: NEGATIVE

## 2017-09-19 MED ORDER — IBUPROFEN 800 MG PO TABS
800.0000 mg | ORAL_TABLET | Freq: Once | ORAL | Status: DC
  Filled 2017-09-19: qty 1

## 2017-09-19 NOTE — ED Triage Notes (Signed)
Pt states left foot "folded over" 2 days-bruising noted-to triage in w/c

## 2017-09-19 NOTE — ED Notes (Signed)
Pt verbalizes understanding of d/c instructions and denies any further needs at this time. 

## 2017-09-19 NOTE — ED Provider Notes (Signed)
[facility] EMERGENCY DEPARTMENT Provider Note   CSN: [id number] Arrival date & time: 09/19/17  1742     History   Chief Complaint Chief Complaint  Patient presents with  . Foot Injury    HPI Mckenzie Williams is a 32 y.o. female.  HPI   Patient is a 32 year old female with a history of hypertension presenting for left ankle injury.  Patient reports that 2 days ago, her left foot fell asleep on her desk, when she got up to ambulate, she inverted her left ankle.  Patient reports she was able to ambulate on the foot, but with difficulty.  Patient reports she has been hobbling on the left foot over the past 2 days.  Patient reports she noted some edema developing over the foot and bruising over the foot.  Patient denies any loss of sensation or weakness of the lower externally.  Past Medical History:  Diagnosis Date  . Hypertension     There are no active problems to display for this patient.   Past Surgical History:  Procedure Laterality Date  . WISDOM TOOTH EXTRACTION       OB History    Gravida  0   Para  0   Term  0   Preterm  0   AB  0   Living        SAB  0   TAB  0   Ectopic  0   Multiple      Live Births               Home Medications    Prior to Admission medications   Medication Sig Start Date End Date Taking? Authorizing Provider  ibuprofen (ADVIL,MOTRIN) 200 MG tablet Take 200 mg by mouth every 6 (six) hours as needed. Pain   Yes [provider]    Family History Family History  Problem Relation Age of Onset  . Hypertension Father     Social History Social History   Tobacco Use  . Smoking status: Current Every Day Smoker    Packs/day: 0.50    Years: 4.00    Pack years: 2.00    Types: Cigarettes  . Smokeless tobacco: Never Used  Substance Use Topics  . Alcohol use: Yes    Comment: weekly  . Drug use: No     Allergies   Patient has no known allergies.   Review of Systems Review of Systems    Musculoskeletal: Positive for arthralgias and joint swelling.  Skin: Positive for color change.  Neurological: Negative for weakness and numbness.     Physical Exam Updated Vital Signs BP (!) 156/100 (BP Location: Right Arm)   Pulse (!) 113   Temp 98.9 F (37.2 C) (Oral)   Resp 18   Ht 5\' 8"  (1.727 m)   Wt 61.2 kg (135 lb)   LMP 09/10/2017   SpO2 99%   BMI 20.53 kg/m   Physical Exam  Constitutional: She appears well-developed and well-nourished. No distress.  Sitting comfortably in bed.  HENT:  Head: Normocephalic and atraumatic.  Eyes: Conjunctivae are normal. Right eye exhibits no discharge. Left eye exhibits no discharge.  EOMs normal to gross examination.  Neck: Normal range of motion.  Cardiovascular: Normal rate and regular rhythm.  Intact, 2+ DP and PT pulses bilaterally.  Pulmonary/Chest:  Normal respiratory effort. Patient converses comfortably. No audible wheeze or stridor.  Abdominal: She exhibits no distension.  Musculoskeletal:  Left ankle with tenderness to palpation of tissue  around lateral malleolus. Swelling noted diffusely across the forefoot.  ROM decreased with inversion/eversion due to pain. Ecchymosis but no deformity appreciated. No break in skin. No pain to fifth metatarsal area or navicular region. Good pedal pulse and cap refill of toes. Sensation intact to light touch distally.  Neurological: She is alert.  Cranial nerves intact to gross observation. Patient moves extremities without difficulty.  Skin: Skin is warm and dry. She is not diaphoretic.  Psychiatric: She has a normal mood and affect. Her behavior is normal. Judgment and thought content normal.  Nursing note and vitals reviewed.    ED Treatments / Results  Labs (all labs ordered are listed, but only abnormal results are displayed) Labs Reviewed  PREGNANCY, URINE    EKG None  Radiology Dg Ankle Complete Left  Result Date: 09/19/2017 CLINICAL DATA:  Foot and ankle pain after  twisting injury 3 days ago. EXAM: LEFT FOOT - COMPLETE 3+ VIEW; LEFT ANKLE COMPLETE - 3+ VIEW COMPARISON:  None. FINDINGS: No acute fracture or dislocation. The talar dome is intact. The ankle mortise is symmetric. No significant tibiotalar joint effusion. Joint spaces are preserved. Bone mineralization is normal. Mild soft tissue swelling over the lateral malleolus. IMPRESSION: 1. No acute osseous abnormality of the left ankle or foot. Electronically Signed   By: Obie Dredge M.D.   On: 09/19/2017 18:25   Dg Foot Complete Left  Result Date: 09/19/2017 CLINICAL DATA:  Foot and ankle pain after twisting injury 3 days ago. EXAM: LEFT FOOT - COMPLETE 3+ VIEW; LEFT ANKLE COMPLETE - 3+ VIEW COMPARISON:  None. FINDINGS: No acute fracture or dislocation. The talar dome is intact. The ankle mortise is symmetric. No significant tibiotalar joint effusion. Joint spaces are preserved. Bone mineralization is normal. Mild soft tissue swelling over the lateral malleolus. IMPRESSION: 1. No acute osseous abnormality of the left ankle or foot. Electronically Signed   By: Obie Dredge M.D.   On: 09/19/2017 18:25    Procedures Procedures (including critical care time)  Medications Ordered in ED Medications - No data to display   Initial Impression / Assessment and Plan / ED Course  I have reviewed the triage vital signs and the nursing notes.  Pertinent labs & imaging results that were available during my care of the patient were reviewed by me and considered in my medical decision making (see chart for details).  Clinical Course as of Sep 20 2047  Wed Sep 19, 2017  1849 Pulse rechecked and 100 on my examination.   [AM]    Clinical Course User Index [AM] Elisha Ponder, PA-C    Patient well-appearing in no acute distress.  Patient exhibits ecchymosis and swelling over the lateral malleolus and forefoot.  No evidence of Lisfranc injury.  X-ray negative for fracture.  Suspect anterior talofibular  ligament.  Patient can return precautions for any increase in swelling, pain, pallor, paresthesias.  Patient instructed on RICE therapy, and given sports medicine follow-up.  Patient is in understanding agrees with plan of care.  Patient also noted to have elevated blood pressure.  I discussed with the patient.  Patient is in process of trying to find a primary care provider.  Patient also noted to have elevated pulse on arrival.  This is resolved by my examination.  Final Clinical Impressions(s) / ED Diagnoses   Final diagnoses:  Sprain of left ankle, unspecified ligament, initial encounter  Elevated blood-pressure reading without diagnosis of hypertension    ED Discharge Orders  None       Delia ChimesMurray, Carmine Youngberg B, PA-C 09/19/17 2101    Melene PlanFloyd, Dan, DO 09/19/17 2111

## 2017-09-19 NOTE — ED Notes (Signed)
Pt took 2 aleve at [id number]

## 2017-09-19 NOTE — Discharge Instructions (Addendum)
Please see the information and instructions below regarding your visit.  Your diagnoses today include:  1. Sprain of left ankle, unspecified ligament, initial encounter   2. Fall   3. Elevated blood-pressure reading without diagnosis of hypertension    Your provider has diagnosed you as suffering from an ankle sprain. Ankle sprain occurs when the ligaments that hold the ankle joint together are stretched or torn. It may take 4 to 6 weeks to heal.  Tests performed today include: An x-ray of your ankle - does NOT show any broken bones  See side panel of your discharge paperwork for testing performed today. Vital signs are listed at the bottom of these instructions.   Medications prescribed:  Take any prescribed medications only as prescribed, and any over the counter medications only as directed on the packaging.  You may take 600 mg of ibuprofen every 6 hours with 650 mg of Tylenol in between.  Do not exceed 4 g of Tylenol in 1 day.  Home care instructions:  Follow R.I.C.E. Protocol: R - rest your injury  I  - use ice on injury without applying directly to skin C - compress injury with bandage or splint E - elevate the injury as much as possible  For Activity: Wear ankle brace for at least 2 weeks for stabilization of ankle. If prescribed crutches, use crutches with non-weight bearing for the first few days. Then, you may walk on your ankle as the pain allows, or as instructed. Start gradually with weight bearing on the affected ankle. Once you can walk pain free, then try jogging. When you can run forwards, then you can try moving side-to-side. If you cannot walk without crutches in one week, you need a re-check.  Please follow any educational materials contained in this packet.   Follow-up instructions: Please follow-up with your primary care provider or the provided orthopedic (bone specialist) listed in this packet if you continue to have significant pain or trouble walking in 1  week. In this case you may have a severe sprain that requires further care.   Return instructions:  Please return if your toes are numb or tingling, appear gray or blue, are much colder than your other foot, or you have severe pain (also elevate leg and loosen splint or wrap). Please return to the Emergency Department if you experience worsening symptoms.  Please return if you have any other emergent concerns.  Additional Information: To find a primary care or specialty doctor please call [phone] or [phone] to access "[facility] Find a Doctor Service."  You may also go on the [facility] website at [email or web address]  There are also multiple [facility] practices throughout the [place] that are frequently accepting new patients. You may find a clinic that is close to your home and contact them.  [facility] - [address] [place] [phone]  [facility] in [place] (also locations in [place] and [place]) - [address] [place]  [phone]  [facility] - [address] [phone]    Your vital signs today were: BP (!) 156/100 (BP Location: Right Arm)    Pulse (!) 113    Temp 98.9 F (37.2 C) (Oral)    Resp 18    Ht 5\' 8"  (1.727 m)    Wt 61.2 kg (135 lb)    LMP 09/10/2017    SpO2 99%    BMI 20.53 kg/m  If your blood  pressure (BP) was elevated on multiple readings during this visit above 130 for the top number or above 80 for the bottom number, please have this repeated by your primary care provider within one month. --------------  Thank you for allowing [place] to participate in your care today.

## 2018-01-14 ENCOUNTER — Ambulatory Visit: Payer: Self-pay | Admitting: Family Medicine

## 2018-01-14 VITALS — BP 140/90 | HR 110 | Temp 98.3°F | Resp 16 | Wt 136.8 lb

## 2018-01-14 DIAGNOSIS — Z3490 Encounter for supervision of normal pregnancy, unspecified, unspecified trimester: Secondary | ICD-10-CM

## 2018-01-14 NOTE — Patient Instructions (Signed)

## 2018-01-14 NOTE — Progress Notes (Signed)
Mckenzie Williams is a 32 y.o. female who presents today with concerns of need for a pregnancy test due to concerns of changes in menstrual cycle, and intermittent unexplained nausea.  Review of Systems  Constitutional: Negative for chills, fever and malaise/fatigue.  HENT: Negative for congestion, ear discharge, ear pain, sinus pain and sore throat.   Eyes: Negative.   Respiratory: Negative for cough, sputum production and shortness of breath.   Cardiovascular: Negative.  Negative for chest pain.  Gastrointestinal: Positive for nausea and vomiting. Negative for diarrhea.  Genitourinary: Negative for dysuria, frequency, hematuria and urgency.       Patient describes intermittent crampy sensation  Musculoskeletal: Negative for myalgias.  Skin: Negative.   Neurological: Negative for headaches.  Endo/Heme/Allergies: Negative.   Psychiatric/Behavioral: Negative.     O: Vitals:   01/14/18 1427  BP: 140/90  Pulse: (!) 110  Resp: 16  Temp: 98.3 F (36.8 C)  SpO2: 98%     Physical Exam  Constitutional: She is oriented to person, place, and time. Vital signs are normal. She appears well-developed and well-nourished. She is active.  Non-toxic appearance. She does not have a sickly appearance.  HENT:  Head: Normocephalic.  Right Ear: Hearing, tympanic membrane, external ear and ear canal normal.  Left Ear: Hearing, tympanic membrane, external ear and ear canal normal.  Nose: Nose normal.  Mouth/Throat: Uvula is midline and oropharynx is clear and moist.  Neck: Normal range of motion. Neck supple.  Cardiovascular: Regular rhythm, normal heart sounds and normal pulses. Tachycardia present.  BP 140/90 observed on exam  Pulmonary/Chest: Effort normal and breath sounds normal.  Abdominal: Soft. Bowel sounds are normal.  Musculoskeletal: Normal range of motion.  Lymphadenopathy:       Head (right side): No submental and no submandibular adenopathy present.       Head (left side): No  submental and no submandibular adenopathy present.    She has no cervical adenopathy.  Neurological: She is alert and oriented to person, place, and time.  Psychiatric: She has a normal mood and affect.  Vitals reviewed.   A: 1. Pregnancy, unspecified gestational age     P: Exam findings, diagnosis etiology and medication use and indications reviewed with patient. Follow- Up and discharge instructions provided. No emergent/urgent issues found on exam.  Patient verbalized understanding of information provided and agrees with plan of care (POC), all questions answered.  1. Pregnancy, unspecified gestational age - POCT urine pregnancy NEGATIVE RESULT  Discussed with patient about local resources to receive care for those who are uninsured. Patient verbalized understanding of results and need for comprehensive evaluation and follow up. Reports last comprehensive GYN was over 5 years ago. Noted and dicussed borderline blood pressure and elevated pulse.

## 2018-03-20 ENCOUNTER — Emergency Department (HOSPITAL_BASED_OUTPATIENT_CLINIC_OR_DEPARTMENT_OTHER)
Admission: EM | Admit: 2018-03-20 | Discharge: 2018-03-20 | Disposition: A | Discharge status: Home / Self Care | Payer: BLUE CROSS/BLUE SHIELD | Attending: Emergency Medicine | Admitting: Emergency Medicine

## 2018-03-20 ENCOUNTER — Emergency Department (HOSPITAL_BASED_OUTPATIENT_CLINIC_OR_DEPARTMENT_OTHER): Payer: BLUE CROSS/BLUE SHIELD

## 2018-03-20 ENCOUNTER — Encounter (HOSPITAL_BASED_OUTPATIENT_CLINIC_OR_DEPARTMENT_OTHER): Payer: Self-pay | Admitting: *Deleted

## 2018-03-20 ENCOUNTER — Other Ambulatory Visit: Payer: Self-pay

## 2018-03-20 DIAGNOSIS — K529 Noninfective gastroenteritis and colitis, unspecified: Secondary | ICD-10-CM | POA: Diagnosis not present

## 2018-03-20 DIAGNOSIS — F17210 Nicotine dependence, cigarettes, uncomplicated: Secondary | ICD-10-CM | POA: Diagnosis not present

## 2018-03-20 DIAGNOSIS — N760 Acute vaginitis: Secondary | ICD-10-CM | POA: Diagnosis not present

## 2018-03-20 DIAGNOSIS — I10 Essential (primary) hypertension: Secondary | ICD-10-CM | POA: Insufficient documentation

## 2018-03-20 DIAGNOSIS — R1030 Lower abdominal pain, unspecified: Secondary | ICD-10-CM | POA: Diagnosis present

## 2018-03-20 DIAGNOSIS — B9689 Other specified bacterial agents as the cause of diseases classified elsewhere: Secondary | ICD-10-CM | POA: Diagnosis not present

## 2018-03-20 HISTORY — DX: Unspecified ovarian cyst, unspecified side: N83.209

## 2018-03-20 LAB — CBC WITH DIFFERENTIAL/PLATELET
BASOS ABS: 0 10*3/uL (ref 0.0–0.1)
Basophils Relative: 0 %
Eosinophils Absolute: 0 10*3/uL (ref 0.0–0.7)
Eosinophils Relative: 0 %
HCT: 44.7 % (ref 36.0–46.0)
Hemoglobin: 15.6 g/dL — ABNORMAL HIGH (ref 12.0–15.0)
LYMPHS PCT: 17 %
Lymphs Abs: 1.4 10*3/uL (ref 0.7–4.0)
MCH: 32.9 pg (ref 26.0–34.0)
MCHC: 34.9 g/dL (ref 30.0–36.0)
MCV: 94.3 fL (ref 78.0–100.0)
Monocytes Absolute: 0.6 10*3/uL (ref 0.1–1.0)
Monocytes Relative: 7 %
Neutro Abs: 6.1 10*3/uL (ref 1.7–7.7)
Neutrophils Relative %: 76 %
PLATELETS: 272 10*3/uL (ref 150–400)
RBC: 4.74 MIL/uL (ref 3.87–5.11)
RDW: 11.9 % (ref 11.5–15.5)
WBC: 8.1 10*3/uL (ref 4.0–10.5)

## 2018-03-20 LAB — WET PREP, GENITAL
SPERM: NONE SEEN
Trich, Wet Prep: NONE SEEN
YEAST WET PREP: NONE SEEN

## 2018-03-20 LAB — URINALYSIS, ROUTINE W REFLEX MICROSCOPIC
GLUCOSE, UA: NEGATIVE mg/dL
Hgb urine dipstick: NEGATIVE
Leukocytes, UA: NEGATIVE
Nitrite: NEGATIVE
PROTEIN: 30 mg/dL — AB
Specific Gravity, Urine: >1.03 — ABNORMAL HIGH (ref 1.005–1.030)
pH: 6.5 (ref 5.0–8.0)

## 2018-03-20 LAB — LIPASE, BLOOD: Lipase: 49 U/L (ref 11–51)

## 2018-03-20 LAB — I-STAT CG4 LACTIC ACID, ED: Lactic Acid, Venous: 0.95 mmol/L (ref 0.5–1.9)

## 2018-03-20 LAB — COMPREHENSIVE METABOLIC PANEL
ALT: 38 U/L (ref 0–44)
ANION GAP: 13 (ref 5–15)
AST: 30 U/L (ref 15–41)
Albumin: 4.9 g/dL (ref 3.5–5.0)
Alkaline Phosphatase: 53 U/L (ref 38–126)
BUN: 8 mg/dL (ref 6–20)
CHLORIDE: 101 mmol/L (ref 98–111)
CO2: 22 mmol/L (ref 22–32)
Calcium: 9.7 mg/dL (ref 8.9–10.3)
Creatinine, Ser: 0.58 mg/dL (ref 0.44–1.00)
GLUCOSE: 102 mg/dL — AB (ref 70–99)
Potassium: 3.7 mmol/L (ref 3.5–5.1)
Sodium: 136 mmol/L (ref 135–145)
TOTAL PROTEIN: 7.9 g/dL (ref 6.5–8.1)
Total Bilirubin: 0.7 mg/dL (ref 0.3–1.2)

## 2018-03-20 LAB — URINALYSIS, MICROSCOPIC (REFLEX)

## 2018-03-20 LAB — PREGNANCY, URINE: Preg Test, Ur: NEGATIVE

## 2018-03-20 MED ORDER — ONDANSETRON HCL 4 MG PO TABS: 4.0000 mg | ORAL_TABLET | Freq: Four times a day (QID) | ORAL | 0 refills | Status: DC

## 2018-03-20 MED ORDER — MORPHINE SULFATE (PF) 4 MG/ML IV SOLN
4.0000 mg | Freq: Once | INTRAVENOUS | Status: AC
  Administered 2018-03-20: 4 mg via INTRAVENOUS
  Filled 2018-03-20: qty 1

## 2018-03-20 MED ORDER — ONDANSETRON HCL 4 MG/2ML IJ SOLN
4.0000 mg | Freq: Once | INTRAMUSCULAR | Status: AC
  Administered 2018-03-20: 4 mg via INTRAVENOUS
  Filled 2018-03-20: qty 2

## 2018-03-20 MED ORDER — METRONIDAZOLE 500 MG PO TABS: 500.0000 mg | ORAL_TABLET | Freq: Three times a day (TID) | ORAL | 0 refills | Status: AC

## 2018-03-20 MED ORDER — HYDROCODONE-ACETAMINOPHEN 5-325 MG PO TABS: 1.0000 | ORAL_TABLET | Freq: Four times a day (QID) | ORAL | 0 refills | Status: DC | PRN

## 2018-03-20 MED ORDER — CIPROFLOXACIN HCL 500 MG PO TABS: 500.0000 mg | ORAL_TABLET | Freq: Two times a day (BID) | ORAL | 0 refills | Status: DC

## 2018-03-20 MED ORDER — SODIUM CHLORIDE 0.9 % IV BOLUS
1000.0000 mL | Freq: Once | INTRAVENOUS | Status: AC
  Administered 2018-03-20: 1000 mL via INTRAVENOUS

## 2018-03-20 MED ORDER — KETOROLAC TROMETHAMINE 30 MG/ML IJ SOLN
30.0000 mg | Freq: Once | INTRAMUSCULAR | Status: AC
  Administered 2018-03-20: 30 mg via INTRAVENOUS
  Filled 2018-03-20: qty 1

## 2018-03-20 MED ORDER — IOPAMIDOL (ISOVUE-300) INJECTION 61%
100.0000 mL | Freq: Once | INTRAVENOUS | Status: AC | PRN
  Administered 2018-03-20: 100 mL via INTRAVENOUS

## 2018-03-20 MED ORDER — HYDROMORPHONE HCL 1 MG/ML IJ SOLN
1.0000 mg | Freq: Once | INTRAMUSCULAR | Status: AC
  Administered 2018-03-20: 1 mg via INTRAVENOUS
  Filled 2018-03-20: qty 1

## 2018-03-20 NOTE — ED Provider Notes (Signed)
[facility] EMERGENCY DEPARTMENT Provider Note   CSN: [id number] Arrival date & time: 03/20/18  1405     History   Chief Complaint Chief Complaint  Patient presents with  . Abdominal Pain    HPI Mckenzie Williams is a 32 y.o. female history of hypertension, ovarian cyst who presents for evaluation of lower abdominal pain that began about 2-3 AM this morning associated with nausea/vomiting.  Patient states that she was woken up from sleep with pain.  Patient reports that since then, the pain is been constant in her lower abdomen, slightly worse on her left lower side.  She states that she is taken ibuprofen with minimal improvement.  She does report that she has had nausea/vomiting.  Reports emesis is nonbloody, nonbilious.  Last bowel movement was yesterday and was normal.  No presence of blood.  Patient states she has not had any dysuria, hematuria.  Patient does report a history of a ovarian cyst and states this feels slightly different than her previous history of ovarian cyst.  She is not having any vaginal bleeding or vaginal discharge.  She has not had any fevers at home that she states she did not measure.    The history is provided by the patient.    Past Medical History:  Diagnosis Date  . Hypertension   . Ovarian cyst     There are no active problems to display for this patient.   Past Surgical History:  Procedure Laterality Date  . WISDOM TOOTH EXTRACTION       OB History    Gravida  0   Para  0   Term  0   Preterm  0   AB  0   Living        SAB  0   TAB  0   Ectopic  0   Multiple      Live Births               Home Medications    Prior to Admission medications   Medication Sig Start Date End Date Taking? Authorizing Provider  ciprofloxacin (CIPRO) 500 MG tablet Take 1 tablet (500 mg total) by mouth every 12 (twelve) hours. 03/20/18   Maxwell Caul, PA-C  HYDROcodone-acetaminophen (NORCO/VICODIN) 5-325 MG tablet Take 1-2  tablets by mouth every 6 (six) hours as needed. 03/20/18   Maxwell Caul, PA-C  ibuprofen (ADVIL,MOTRIN) 200 MG tablet Take 200 mg by mouth every 6 (six) hours as needed. Pain    [provider]  metroNIDAZOLE (FLAGYL) 500 MG tablet Take 1 tablet (500 mg total) by mouth 3 (three) times daily for 7 days. 03/20/18 03/27/18  Graciella Freer A, PA-C  ondansetron (ZOFRAN) 4 MG tablet Take 1 tablet (4 mg total) by mouth every 6 (six) hours. 03/20/18   Maxwell Caul, PA-C    Family History Family History  Problem Relation Age of Onset  . Hypertension Father     Social History Social History   Tobacco Use  . Smoking status: Current Some Day Smoker    Packs/day: 0.50    Years: 4.00    Pack years: 2.00    Types: Cigarettes  . Smokeless tobacco: Never Used  Substance Use Topics  . Alcohol use: Yes    Comment: weekly  . Drug use: No     Allergies   Patient has no known allergies.   Review of Systems Review of Systems  Constitutional: Negative for fever.  Respiratory: Negative  for cough and shortness of breath.   Cardiovascular: Negative for chest pain.  Gastrointestinal: Positive for abdominal pain, nausea and vomiting.  Genitourinary: Negative for dysuria and hematuria.  Neurological: Negative for headaches.  All other systems reviewed and are negative.    Physical Exam Updated Vital Signs BP (!) 141/99 (BP Location: Right Arm)   Pulse 96   Temp 98 F (36.7 C) (Oral)   Resp 18   Ht 5\' 8"  (1.727 m)   Wt 63.5 kg   LMP 03/11/2018   SpO2 100%   BMI 21.29 kg/m   Physical Exam  Constitutional: She is oriented to person, place, and time. She appears well-developed and well-nourished.  Appears uncomfortable but no acute distress   HENT:  Head: Normocephalic and atraumatic.  Mouth/Throat: Oropharynx is clear and moist and mucous membranes are normal.  Eyes: Pupils are equal, round, and reactive to light. Conjunctivae, EOM and lids are normal.  Neck: Full  passive range of motion without pain.  Cardiovascular: Normal rate, regular rhythm, normal heart sounds and normal pulses. Exam reveals no gallop and no friction rub.  No murmur heard. Pulmonary/Chest: Effort normal and breath sounds normal.  Abdominal: Soft. Normal appearance. There is tenderness in the right lower quadrant, suprapubic area and left lower quadrant. There is tenderness at McBurney's point. There is no rigidity and no guarding.  Abdomen is soft, nondistended.  Tenderness palpation of lower abdomen, particularly the left lower quadrant right lower quadrant.  No rebound, positive Rovsing's.  Genitourinary: Uterus normal. Cervix exhibits no motion tenderness, no discharge and no friability. Right adnexum displays no mass and no tenderness. Left adnexum displays tenderness. Left adnexum displays no mass.  Genitourinary Comments: The exam was performed with a chaperone present. Normal external female genitalia. No lesions, rash, or sores. Tenderness palpation noted to the left adnexa.  No right adnexal tenderness.  No CMT.  No cervical friability.  Musculoskeletal: Normal range of motion.  Neurological: She is alert and oriented to person, place, and time.  Skin: Skin is warm and dry. Capillary refill takes less than 2 seconds.  Psychiatric: She has a normal mood and affect. Her speech is normal.  Nursing note and vitals reviewed.    ED Treatments / Results  Labs (all labs ordered are listed, but only abnormal results are displayed) Labs Reviewed  WET PREP, GENITAL - Abnormal; Notable for the following components:      Result Value   Clue Cells Wet Prep HPF POC PRESENT (*)    WBC, Wet Prep HPF POC FEW (*)    All other components within normal limits  URINALYSIS, ROUTINE W REFLEX MICROSCOPIC - Abnormal; Notable for the following components:   Specific Gravity, Urine >1.030 (*)    Bilirubin Urine SMALL (*)    Ketones, ur >80 (*)    Protein, ur 30 (*)    All other components  within normal limits  URINALYSIS, MICROSCOPIC (REFLEX) - Abnormal; Notable for the following components:   Bacteria, UA MANY (*)    All other components within normal limits  COMPREHENSIVE METABOLIC PANEL - Abnormal; Notable for the following components:   Glucose, Bld 102 (*)    All other components within normal limits  CBC WITH DIFFERENTIAL/PLATELET - Abnormal; Notable for the following components:   Hemoglobin 15.6 (*)    All other components within normal limits  PREGNANCY, URINE  LIPASE, BLOOD  I-STAT CG4 LACTIC ACID, ED  GC/CHLAMYDIA PROBE AMP ([place]) NOT AT [facility]    EKG  None  Radiology US Transvaginal Non-ob  Result Date: 03/20/2018 CLINICAL DATA:  LEFT adnexal pain for 18 hours. EXAM: TRANSVAGINAL ULTRASOUND OF PELVIS DOPPLER ULTRASOUND OF OVARIES TECHNIQUE: Transvaginal ultrasound examination of the pelvis was performed including evaluation of the uterus, ovaries, adnexal regions, and pelvic cul-de-sac. Color and duplex Doppler ultrasound was utilized to evaluate blood flow to the ovaries. COMPARISON:  None. FINDINGS: Uterus Measurements: 6.5 x 3.5 x 4.1 cm. No fibroids or other mass visualized. Endometrium Thickness: 8 mm.  No focal abnormality visualized. Right ovary Measurements: 4.7 x 2.5 x 2.9 cm. Normal appearance/no adnexal mass. Normal dominant follicle. Left ovary Measurements: 3.1 x 1.7 x 2.4 cm. Normal appearance/no adnexal mass. Pulsed Doppler evaluation demonstrates normal low-resistance arterial and venous waveforms in both ovaries. IMPRESSION: Normal pelvic ultrasound. Electronically Signed   By: Bary Richard M.D.   On: 03/20/2018 20:28   Ct Abdomen Pelvis W Contrast  Result Date: 03/20/2018 CLINICAL DATA:  32 year old female with LEFT lower quadrant pain and nausea/vomiting onset today. History of ovarian cyst. EXAM: CT ABDOMEN AND PELVIS WITH CONTRAST TECHNIQUE: Multidetector CT imaging of the abdomen and pelvis was performed using the standard protocol  following bolus administration of intravenous contrast. CONTRAST:  ISOVUE-300 IOPAMIDOL (ISOVUE-300) INJECTION 61% COMPARISON:  CT abdomen dated 01/17/2017. FINDINGS: Lower chest: No acute abnormality. Hepatobiliary: RIGHT hepatic cyst, as previously described. No acute liver abnormality. Gallbladder appears normal. No bile duct dilatation. Pancreas: Unremarkable. No pancreatic ductal dilatation or surrounding inflammatory changes. Spleen: Normal in size without focal abnormality. Adrenals/Urinary Tract: Adrenal glands appear normal. Kidneys are unremarkable without mass, stone or hydronephrosis. No perinephric fluid. No ureteral or bladder calculi identified. Bladder appears normal. Stomach/Bowel: Walls of the large bowel are difficult to characterize due to relative decompression, however, I suspect at least mild thickening of the walls of the LEFT colon and transverse colon, suspicious for an acute colitis of infectious or inflammatory nature. No dilated large or small bowel loops. Stomach is unremarkable. Appendix is normal. Vascular/Lymphatic: No significant vascular findings are present. No enlarged abdominal or pelvic lymph nodes. Reproductive: Uterus and bilateral adnexa are unremarkable. Other: No free fluid or abscess collection. No free intraperitoneal air. Musculoskeletal: No acute or suspicious osseous finding. IMPRESSION: 1. Suspected colitis of infectious or inflammatory nature involving the LEFT colon and transverse colon. 2. Remainder of the abdomen and pelvis is unremarkable. No bowel obstruction. No free fluid, abscess collection or free intraperitoneal air. No evidence of acute solid organ abnormality. No renal or ureteral calculi. Adnexal structures are unremarkable. Appendix is normal. Electronically Signed   By: Bary Richard M.D.   On: 03/20/2018 19:21   [place] Art/ven Flow Abd Pelv Doppler  Result Date: 03/20/2018 CLINICAL DATA:  LEFT adnexal pain for 18 hours. EXAM: TRANSVAGINAL  ULTRASOUND OF PELVIS DOPPLER ULTRASOUND OF OVARIES TECHNIQUE: Transvaginal ultrasound examination of the pelvis was performed including evaluation of the uterus, ovaries, adnexal regions, and pelvic cul-de-sac. Color and duplex Doppler ultrasound was utilized to evaluate blood flow to the ovaries. COMPARISON:  None. FINDINGS: Uterus Measurements: 6.5 x 3.5 x 4.1 cm. No fibroids or other mass visualized. Endometrium Thickness: 8 mm.  No focal abnormality visualized. Right ovary Measurements: 4.7 x 2.5 x 2.9 cm. Normal appearance/no adnexal mass. Normal dominant follicle. Left ovary Measurements: 3.1 x 1.7 x 2.4 cm. Normal appearance/no adnexal mass. Pulsed Doppler evaluation demonstrates normal low-resistance arterial and venous waveforms in both ovaries. IMPRESSION: Normal pelvic ultrasound. Electronically Signed   By: Anne Ng.D.  On: 03/20/2018 20:28    Procedures Procedures (including critical care time)  Medications Ordered in ED Medications  sodium chloride 0.9 % bolus 1,000 mL (0 mLs Intravenous Stopped 03/20/18 1809)  ondansetron (ZOFRAN) injection 4 mg (4 mg Intravenous Given 03/20/18 1654)  morphine 4 MG/ML injection 4 mg (4 mg Intravenous Given 03/20/18 1654)  ketorolac (TORADOL) 30 MG/ML injection 30 mg (30 mg Intravenous Given 03/20/18 1814)  iopamidol (ISOVUE-300) 61 % injection 100 mL (100 mLs Intravenous Contrast Given 03/20/18 1852)  HYDROmorphone (DILAUDID) injection 1 mg (1 mg Intravenous Given 03/20/18 1938)     Initial Impression / Assessment and Plan / ED Course  I have reviewed the triage vital signs and the nursing notes.  Pertinent labs & imaging results that were available during my care of the patient were reviewed by me and considered in my medical decision making (see chart for details).     32 year old female who presents for evaluation of left lower quadrant abdominal pain that began approximately 2 AM this morning.  Associate with nausea/vomiting.  No fevers.   Reports pain is now on the entire lower abdomen.  Last bowel movement was yesterday was normal.  No fevers noted at home.  Does have a history of ovarian cyst but she states this feels different.  On initial ED arrival, patient is afebrile, appears uncomfortable but nontoxic.  Vital signs reviewed and stable.  On exam, patient with lower abdominal tenderness, particularly she has point tenderness noted to the right lower quadrant at McBurney's point and tenderness noted to the suprapubic and left lower quadrant.  Concern for appendicitis versus ovarian etiology versus infectious etiology pyelonephritis versus kidney stone.  We will plan to check basic labs, UA.  Analgesics provided.  I-STAT lactate negative.  Lipase negative.  CBC without any significant leukocytosis or anemia.  CMP is unremarkable.  Urine shows small amount of ketones otherwise unremarkable.  Urine pregnancy negative.  CT scan shows suspected colitis of infectious or inflammatory nature involving the left colon and transverse colon.  No evidence of bowel obstruction.  Appendix is normal.  Adnexal structures are unremarkable.  Pelvic exam as documented above.  She declined wanting to have gonorrhea tested.  We will plan for wet prep.  Patient with some left adnexal tenderness.  He also had some white vaginal discharge.  No CMT that would could be concerning for PID.  Given concern for acute nature and pain on pelvic exam, will plan for ultrasound to rule out ovarian etiology.  Discussed plan with patient she is agreeable.  Wet Prep shows positive clue cells.  Given discharge, will plan to treat.  Present reviewed.  No evidence of torsion noted on exam.  Discussed results with patient.  She reports improvement in pain after analgesics here in the ED. vital signs are stable.  Patient has been able to tolerate p.o. in the department any difficulty.  Repeat abdominal exam is improved.  We will plan to send patient home with Flagyl and Cipro  for treatment of colitis.  Additionally, Flagyl will cover for BV.  Encourage at home supportive care measures.  Instructed patient to follow-up with primary care doctor.  Patient reviewed on PMP with no recent narcotic prescription.  Will give short course pain medication for pain at home. Patient had ample opportunity for questions and discussion. All patient's questions were answered with full understanding. Strict return precautions discussed. Patient expresses understanding and agreement to plan.    Final Clinical Impressions(s) / ED Diagnoses  Final diagnoses:  Colitis  Bacterial vaginosis    ED Discharge Orders         Ordered    HYDROcodone-acetaminophen (NORCO/VICODIN) 5-325 MG tablet  Every 6 hours PRN     03/20/18 2035    metroNIDAZOLE (FLAGYL) 500 MG tablet  3 times daily     03/20/18 2035    ciprofloxacin (CIPRO) 500 MG tablet  Every 12 hours     03/20/18 2035    ondansetron (ZOFRAN) 4 MG tablet  Every 6 hours     03/20/18 2035           Maxwell Caul, PA-C 03/21/18 [address], MD 03/25/18 [phone]

## 2018-03-20 NOTE — ED Triage Notes (Signed)
Ptc/o left lower abd pain x 12 , n/v.

## 2018-03-20 NOTE — ED Notes (Signed)
Pt verbalizes understanding of d/c instructions and denies any further needs at this time. 

## 2018-03-20 NOTE — Discharge Instructions (Signed)
Take antibiotics as directed. Please take all of your antibiotics until finished.  Take Flagyl as directed.  It is very important that you do not consume any alcohol while taking this medication as it will cause you to become violently ill.  You can take Tylenol or Ibuprofen as directed for pain. You can alternate Tylenol and Ibuprofen every 4 hours. If you take Tylenol at 1pm, then you can take Ibuprofen at 5pm. Then you can take Tylenol again at 9pm.  Take pain medication for severe breakthrough pain.  Take Zofran as directed for nausea.  Return to the emergency department for any worsening pain, fever, persistent vomiting, inability to keep any food down or any other worsening or concerning symptoms.

## 2018-05-02 ENCOUNTER — Other Ambulatory Visit: Payer: Self-pay

## 2018-05-02 ENCOUNTER — Emergency Department (HOSPITAL_BASED_OUTPATIENT_CLINIC_OR_DEPARTMENT_OTHER)
Admission: EM | Admit: 2018-05-02 | Discharge: 2018-05-02 | Disposition: A | Discharge status: Home / Self Care | Payer: BLUE CROSS/BLUE SHIELD | Attending: Emergency Medicine | Admitting: Emergency Medicine

## 2018-05-02 ENCOUNTER — Encounter (HOSPITAL_BASED_OUTPATIENT_CLINIC_OR_DEPARTMENT_OTHER): Payer: Self-pay | Admitting: *Deleted

## 2018-05-02 ENCOUNTER — Emergency Department (HOSPITAL_BASED_OUTPATIENT_CLINIC_OR_DEPARTMENT_OTHER): Payer: BLUE CROSS/BLUE SHIELD

## 2018-05-02 DIAGNOSIS — I10 Essential (primary) hypertension: Secondary | ICD-10-CM | POA: Insufficient documentation

## 2018-05-02 DIAGNOSIS — R1031 Right lower quadrant pain: Secondary | ICD-10-CM | POA: Insufficient documentation

## 2018-05-02 DIAGNOSIS — R7401 Elevation of levels of liver transaminase levels: Secondary | ICD-10-CM

## 2018-05-02 DIAGNOSIS — F17210 Nicotine dependence, cigarettes, uncomplicated: Secondary | ICD-10-CM | POA: Diagnosis not present

## 2018-05-02 DIAGNOSIS — R740 Nonspecific elevation of levels of transaminase and lactic acid dehydrogenase [LDH]: Secondary | ICD-10-CM

## 2018-05-02 DIAGNOSIS — R109 Unspecified abdominal pain: Secondary | ICD-10-CM

## 2018-05-02 HISTORY — DX: Noninfective gastroenteritis and colitis, unspecified: K52.9

## 2018-05-02 LAB — COMPREHENSIVE METABOLIC PANEL
ALBUMIN: 4.4 g/dL (ref 3.5–5.0)
ALT: 373 U/L — ABNORMAL HIGH (ref 0–44)
ANION GAP: 12 (ref 5–15)
AST: 279 U/L — ABNORMAL HIGH (ref 15–41)
Alkaline Phosphatase: 237 U/L — ABNORMAL HIGH (ref 38–126)
BUN: 5 mg/dL — ABNORMAL LOW (ref 6–20)
CALCIUM: 8.9 mg/dL (ref 8.9–10.3)
CO2: 22 mmol/L (ref 22–32)
Chloride: 98 mmol/L (ref 98–111)
Creatinine, Ser: 0.56 mg/dL (ref 0.44–1.00)
GFR calc non Af Amer: >60 mL/min (ref >60)
GLUCOSE: 112 mg/dL — AB (ref 70–99)
Potassium: 3.3 mmol/L — ABNORMAL LOW (ref 3.5–5.1)
SODIUM: 132 mmol/L — AB (ref 135–145)
Total Bilirubin: 1.2 mg/dL (ref 0.3–1.2)
Total Protein: 7.7 g/dL (ref 6.5–8.1)

## 2018-05-02 LAB — URINALYSIS, ROUTINE W REFLEX MICROSCOPIC
GLUCOSE, UA: NEGATIVE mg/dL
HGB URINE DIPSTICK: NEGATIVE
Ketones, ur: >80 mg/dL — AB
Leukocytes, UA: NEGATIVE
Nitrite: NEGATIVE
Protein, ur: NEGATIVE mg/dL
SPECIFIC GRAVITY, URINE: 1.02 (ref 1.005–1.030)
pH: 6 (ref 5.0–8.0)

## 2018-05-02 LAB — CBC
HCT: 45.5 % (ref 36.0–46.0)
HEMOGLOBIN: 15.1 g/dL — AB (ref 12.0–15.0)
MCH: 31.9 pg (ref 26.0–34.0)
MCHC: 33.2 g/dL (ref 30.0–36.0)
MCV: 96 fL (ref 80.0–100.0)
NRBC: 0 % (ref 0.0–0.2)
PLATELETS: 209 10*3/uL (ref 150–400)
RBC: 4.74 MIL/uL (ref 3.87–5.11)
RDW: 11.8 % (ref 11.5–15.5)
WBC: 5.3 10*3/uL (ref 4.0–10.5)

## 2018-05-02 LAB — LIPASE, BLOOD: Lipase: 30 U/L (ref 11–51)

## 2018-05-02 LAB — MONONUCLEOSIS SCREEN: Mono Screen: NEGATIVE

## 2018-05-02 LAB — PREGNANCY, URINE: Preg Test, Ur: NEGATIVE

## 2018-05-02 MED ORDER — IOPAMIDOL (ISOVUE-300) INJECTION 61%
100.0000 mL | Freq: Once | INTRAVENOUS | Status: AC | PRN
  Administered 2018-05-02: 100 mL via INTRAVENOUS

## 2018-05-02 MED ORDER — MORPHINE SULFATE (PF) 4 MG/ML IV SOLN
4.0000 mg | Freq: Once | INTRAVENOUS | Status: AC
  Administered 2018-05-02: 4 mg via INTRAVENOUS
  Filled 2018-05-02: qty 1

## 2018-05-02 MED ORDER — IOPAMIDOL (ISOVUE-300) INJECTION 61%
30.0000 mL | Freq: Once | INTRAVENOUS | Status: AC | PRN
  Administered 2018-05-02: 15 mL via ORAL

## 2018-05-02 MED ORDER — ONDANSETRON HCL 4 MG/2ML IJ SOLN
4.0000 mg | Freq: Once | INTRAMUSCULAR | Status: AC
  Administered 2018-05-02: 4 mg via INTRAVENOUS
  Filled 2018-05-02: qty 2

## 2018-05-02 MED ORDER — LACTATED RINGERS IV BOLUS
1000.0000 mL | Freq: Once | INTRAVENOUS | Status: AC
  Administered 2018-05-02: 1000 mL via INTRAVENOUS

## 2018-05-02 NOTE — ED Provider Notes (Signed)
[facility] EMERGENCY DEPARTMENT Provider Note   CSN: [id number] Arrival date & time: 05/02/18  1109     History   Chief Complaint Chief Complaint  Patient presents with  . Abdominal Pain    HPI Mckenzie Williams is a 32 y.o. female.  The history is provided by the patient. No language interpreter was used.  Abdominal Pain     Mckenzie Williams is a 32 y.o. female who presents to the Emergency Department complaining of abdominal pain. The emergency department for evaluation of abdominal cramping and pain that began yesterday. She reports severe left sided cramping with associated nausea and decreased appetite. No fevers but she is having chills and feels poorly. No vomiting, diarrhea, dysuria. She was diagnosed with colitis one month ago but overall her symptoms are improving. She drinks a glass of wine daily. Occasional tobacco use, no street drug use. Past Medical History:  Diagnosis Date  . Colitis   . Hypertension   . Ovarian cyst     There are no active problems to display for this patient.   Past Surgical History:  Procedure Laterality Date  . WISDOM TOOTH EXTRACTION       OB History    Gravida  0   Para  0   Term  0   Preterm  0   AB  0   Living        SAB  0   TAB  0   Ectopic  0   Multiple      Live Births               Home Medications    Prior to Admission medications   Medication Sig Start Date End Date Taking? Authorizing Provider  ciprofloxacin (CIPRO) 500 MG tablet Take 1 tablet (500 mg total) by mouth every 12 (twelve) hours. 03/20/18   Maxwell CaulLayden, Lindsey A, PA-C  HYDROcodone-acetaminophen (NORCO/VICODIN) 5-325 MG tablet Take 1-2 tablets by mouth every 6 (six) hours as needed. 03/20/18   Maxwell CaulLayden, Lindsey A, PA-C  ibuprofen (ADVIL,MOTRIN) 200 MG tablet Take 200 mg by mouth every 6 (six) hours as needed. Pain    [provider]  ondansetron (ZOFRAN) 4 MG tablet Take 1 tablet (4 mg total) by mouth every 6 (six) hours.  03/20/18   Maxwell CaulLayden, Lindsey A, PA-C    Family History Family History  Problem Relation Age of Onset  . Hypertension Father     Social History Social History   Tobacco Use  . Smoking status: Current Some Day Smoker    Packs/day: 0.50    Years: 4.00    Pack years: 2.00    Types: Cigarettes  . Smokeless tobacco: Never Used  Substance Use Topics  . Alcohol use: Yes    Comment: weekly  . Drug use: No     Allergies   Patient has no known allergies.   Review of Systems Review of Systems  Gastrointestinal: Positive for abdominal pain.  All other systems reviewed and are negative.    Physical Exam Updated Vital Signs BP (!) 133/98   Pulse (!) 110   Temp 98.1 F (36.7 C) (Oral)   Resp 20   Ht 5\' 8"  (1.727 m)   Wt 61.2 kg   LMP 05/02/2018   SpO2 97%   BMI 20.53 kg/m   Physical Exam  Constitutional: She is oriented to person, place, and time. She appears well-developed and well-nourished.  HENT:  Head: Normocephalic and atraumatic.  Cardiovascular: Regular rhythm.  No murmur heard. tachycardic  Pulmonary/Chest: Effort normal and breath sounds normal. No respiratory distress.  Abdominal: Soft. There is no rebound and no guarding.  Moderate generalized tenderness, greatest over the right lower quadrant. Positive Murphy's sign.  Musculoskeletal: She exhibits no edema or tenderness.  Neurological: She is alert and oriented to person, place, and time.  Skin: Skin is warm and dry.  Psychiatric: She has a normal mood and affect. Her behavior is normal.  Nursing note and vitals reviewed.    ED Treatments / Results  Labs (all labs ordered are listed, but only abnormal results are displayed) Labs Reviewed  COMPREHENSIVE METABOLIC PANEL - Abnormal; Notable for the following components:      Result Value   Sodium 132 (*)    Potassium 3.3 (*)    Glucose, Bld 112 (*)    BUN 5 (*)    AST 279 (*)    ALT 373 (*)    Alkaline Phosphatase 237 (*)    All other  components within normal limits  CBC - Abnormal; Notable for the following components:   Hemoglobin 15.1 (*)    All other components within normal limits  URINALYSIS, ROUTINE W REFLEX MICROSCOPIC - Abnormal; Notable for the following components:   Bilirubin Urine SMALL (*)    Ketones, ur >80 (*)    All other components within normal limits  LIPASE, BLOOD  PREGNANCY, URINE    EKG None  Radiology No results found.  Procedures Procedures (including critical care time)  Medications Ordered in ED Medications  lactated ringers bolus 1,000 mL (has no administration in time range)     Initial Impression / Assessment and Plan / ED Course  I have reviewed the triage vital signs and the nursing notes.  Pertinent labs & imaging results that were available during my care of the patient were reviewed by me and considered in my medical decision making (see chart for details).     Should here for evaluation of left upper quadrant pain since yesterday. She has significant tenderness in the right lower quadrant on palpation. Labs demonstrate transaminases that is new when compared to priors. She does drink one glass of wine daily but denies excessive alcohol use. She does not use acetaminophen or Tylenol containing products. No recent tattoos, needle exposures or recent procedures. No known sick contacts. Right upper quadrant ultrasound is negative for acute process, given her ongoing abdominal tenderness a CT abdomen was obtained which was negative for appendicitis. Patient will need gastroenterology follow-up for further evaluation of her transaminases and abdominal pain. Discussed with patient home care, outpatient follow-up as well as return precautions.  Final Clinical Impressions(s) / ED Diagnoses   Final diagnoses:  Abdominal pain    ED Discharge Orders    None       Tilden Fossa, MD 05/02/18 [id number]

## 2018-05-02 NOTE — ED Notes (Signed)
Patient transported to CT 

## 2018-05-02 NOTE — Discharge Instructions (Signed)
Do not take tylenol (acetaminophen) or drink alcohol.  Drink plenty of fluids.  Get rechecked immediately if you have any new or concerning symptoms.

## 2018-05-02 NOTE — ED Notes (Signed)
Pt returned from ultrasound

## 2018-05-02 NOTE — ED Notes (Signed)
Patient transported to Ultrasound 

## 2018-05-02 NOTE — ED Notes (Signed)
Put socks on patient

## 2018-05-02 NOTE — ED Triage Notes (Signed)
Abdominal cramping.  Pain is worse on the left quadrant. Hx of recent colitis.

## 2018-05-03 LAB — HEPATITIS PANEL, ACUTE
HEP A IGM: NEGATIVE
HEP B C IGM: NEGATIVE
HEP B S AG: NEGATIVE

## 2018-05-07 ENCOUNTER — Encounter: Payer: Self-pay | Admitting: Gastroenterology

## 2018-05-14 ENCOUNTER — Other Ambulatory Visit (INDEPENDENT_AMBULATORY_CARE_PROVIDER_SITE_OTHER): Payer: BLUE CROSS/BLUE SHIELD

## 2018-05-14 ENCOUNTER — Ambulatory Visit: Payer: Self-pay | Admitting: Gastroenterology

## 2018-05-14 ENCOUNTER — Encounter: Payer: Self-pay | Admitting: Gastroenterology

## 2018-05-14 ENCOUNTER — Ambulatory Visit (INDEPENDENT_AMBULATORY_CARE_PROVIDER_SITE_OTHER): Payer: BLUE CROSS/BLUE SHIELD | Admitting: Gastroenterology

## 2018-05-14 ENCOUNTER — Encounter (INDEPENDENT_AMBULATORY_CARE_PROVIDER_SITE_OTHER): Payer: Self-pay

## 2018-05-14 VITALS — BP 136/78 | HR 123 | Ht 68.0 in | Wt 150.5 lb

## 2018-05-14 DIAGNOSIS — R740 Nonspecific elevation of levels of transaminase and lactic acid dehydrogenase [LDH]: Secondary | ICD-10-CM | POA: Diagnosis not present

## 2018-05-14 DIAGNOSIS — R7401 Elevation of levels of liver transaminase levels: Secondary | ICD-10-CM

## 2018-05-14 DIAGNOSIS — R1012 Left upper quadrant pain: Secondary | ICD-10-CM

## 2018-05-14 DIAGNOSIS — R110 Nausea: Secondary | ICD-10-CM

## 2018-05-14 DIAGNOSIS — R933 Abnormal findings on diagnostic imaging of other parts of digestive tract: Secondary | ICD-10-CM

## 2018-05-14 DIAGNOSIS — K921 Melena: Secondary | ICD-10-CM | POA: Diagnosis not present

## 2018-05-14 MED ORDER — PANTOPRAZOLE SODIUM 40 MG PO TBEC: 40.0000 mg | DELAYED_RELEASE_TABLET | Freq: Two times a day (BID) | ORAL | 6 refills | Status: AC

## 2018-05-14 NOTE — Patient Instructions (Signed)
If you are age 31 or older, your body mass index should be between 23-30. Your Body mass index is 22.88 kg/m. If this is out of the aforementioned range listed, please consider follow up with your Primary Care Provider.  If you are age 31 or younger, your body mass index should be between 19-25. Your Body mass index is 22.88 kg/m. If this is out of the aformentioned range listed, please consider follow up with your Primary Care Provider.   You have been scheduled for an endoscopy. Please follow written instructions given to you at your visit today. If you use inhalers (even only as needed), please bring them with you on the day of your procedure. Your physician has requested that you go to www.startemmi.com and enter the access code given to you at your visit today. This web site gives a general overview about your procedure. However, you should still follow specific instructions given to you by our office regarding your preparation for the procedure.  We have sent the following medications to your pharmacy for you to pick up at your convenience: Protonix 40mg  by mouth twice daily for 4 weeks.  It was a pleasure to see you today!  Vito Cirigliano, D.O.

## 2018-05-14 NOTE — Progress Notes (Signed)
Chief Complaint: Elevated LAEs, Hematochezia, Nausea, Abdominal pain, abnormal CT   Referring Provider:     [facility] ER    HPI:     Mckenzie Williams is a 32 y.o. female with a history of ovarian cyst referred to the Gastroenterology Clinic for evaluation of abdominal pain, elevated liver enzymes, recent GI bleeding.  She presented to the emergency department on 10/2, 11/7, and 11/14 with progressive GI issues as outlined below:  -03/20/2018: Lower abdominal pain, nausea/vomiting x1 day, waking her from sleep with abdominal pain.  ER evaluation notable for TTP in RLQ/suprapubic area/LLQ, tenderness at McBurney's point.  Evaluation notable for positive UA with bacteria, ketones, protein along with clue cells on wet prep.  Otherwise negative GC/chlamydia, hCG and normal CBC, CMP, lipase.  Normal transvaginal ultrasound.  CT abdomen/pelvis with right hepatic cyst, mild thickening of the left colon and transverse colon suspicious for acute colitis with the remainder of the intra-abdominal structures normal.  Treated with IV fluids, Zofran, Toradol, Dilaudid and discharged home with Flagyl, Cipro, Zofran, Norco.  Completed Abx as prescribed with overall improvement then recurrence of index pain with nausea and loss of appetite.   -04/25/2018: Presented to [facility] ER with rectal bleeding and abdominal pain.  Reported BRBPR filling toilet water on 2 occasions (and none since 11/7 ER eval) and abdominal cramping.  No TTP on ER exam noted.  Left AMA prior to any labs or rads due to childcare issues.  Pain resolved then recurred again, and no recurrence of bleeding. Some constipation but no diarrhea and no f/c.   -05/02/2018: Presented to [facility], ER with LUQ pain x1 day, with nausea and decreased appetite without emesis or fever.  No diarrhea.  No reported hematochezia at that time per notes.  RLQ TTP noted on exam.  ER evaluation notable for sodium 132, potassium  3.3, AST/ALT/ALP 279/373/237 with normal T bili. Mono negative, HBsAg and HBc IgM negative, HCV negative, HAV negative, normal CBC, normal lipase, normal BUN/creatinine, normal protein/albumin.  UA with positive ketones.  hCG negative.  Repeat CT abdomen/pelvis notable for moderately large feces throughout the colon without mucosal edema, normal appendix and terminal ileum, benign hepatic cyst with otherwise normal-appearing liver, normal pancreas, spleen, stomach, small bowel.  RUQ ultrasound with simple appearing 12 mm right superior hepatic cyst but otherwise normal with normal CBD and patent Dopplers with normal GB and no stones.  Today, she states tried Kratom supplement for abdominal pain between ER visit 1 and 3. She has since stopped that. Otherwise, aside from Abx, no other new meds. Not currently taking any medications or supplements.   No previous known history of liver, GB, or pancreatic disease.  No previous blood transfusion, tattoo, IVDU.  No previous history of viral hepatitis.  Possibly paternal GF with Pancreatic CA, but not entirely certain. Otherwise, no FDR with liver, biliary, pancreatic disease, and no GI malignancy, IBD.    Past Medical History:  Diagnosis Date  . Colitis   . Hypertension   . Ovarian cyst      Past Surgical History:  Procedure Laterality Date  . WISDOM TOOTH EXTRACTION     Family History  Problem Relation Age of Onset  . Hypertension Father    Social History   Tobacco Use  . Smoking status: Current Some Day Smoker    Packs/day: 0.50    Years: 4.00    Pack years:  2.00    Types: Cigarettes  . Smokeless tobacco: Never Used  Substance Use Topics  . Alcohol use: Yes    Comment: weekly  . Drug use: No   Current Outpatient Medications  Medication Sig Dispense Refill  . ciprofloxacin (CIPRO) 500 MG tablet Take 1 tablet (500 mg total) by mouth every 12 (twelve) hours. 10 tablet 0  . HYDROcodone-acetaminophen (NORCO/VICODIN) 5-325 MG tablet  Take 1-2 tablets by mouth every 6 (six) hours as needed. 6 tablet 0  . ibuprofen (ADVIL,MOTRIN) 200 MG tablet Take 200 mg by mouth every 6 (six) hours as needed. Pain    . ondansetron (ZOFRAN) 4 MG tablet Take 1 tablet (4 mg total) by mouth every 6 (six) hours. 6 tablet 0   No current facility-administered medications for this visit.    No Known Allergies   Review of Systems: All systems reviewed and negative except where noted in HPI.     Physical Exam:    Wt Readings from Last 3 Encounters:  05/02/18 135 lb (61.2 kg)  03/20/18 140 lb (63.5 kg)  01/14/18 136 lb 12.8 oz (62.1 kg)    LMP 05/02/2018  Constitutional:  Pleasant, in no acute distress. Psychiatric: Normal mood and affect. Behavior is normal. EENT: Pupils normal.  Conjunctivae are normal. No scleral icterus. Neck supple. No cervical LAD. Cardiovascular: Normal rate, regular rhythm. No edema Pulmonary/chest: Effort normal and breath sounds normal. No wheezing, rales or rhonchi. Abdominal: Soft, nondistended, nontender. Bowel sounds active throughout. There are no masses palpable. No hepatomegaly. Neurological: Alert and oriented to person place and time. Skin: Skin is warm and dry. No rashes noted.   ASSESSMENT AND PLAN;   Mckenzie Williams is a 32 y.o. female presenting with:  1) LUQ pain: 3 separate ER evaluations for abdominal pain which had been largely unrevealing for etiology.  Discussed DDX at length, to include PUD, gastritis, H. pylori, etc. and will evaluate as below:  -EGD to evaluate for mucosal luminal etiology for symptoms -Start Protonix 40 mg p.o. twice daily now for diagnostic and therapeutic intent and evaluate for clinical improvement at time of endoscopy -Decided to hold off on H. pylori testing given recent antibiotics in favor of gastric biopsies at time of EGD  2) Elevated liver enzymes: No previous history of hepatobiliary disease and was normal liver enzymes on initial presentation to ER  on 03/20/2018 with subsequent elevation on repeat ER eval on 05/02/2018.  This is in the setting of taking a new herbal supplement, Kratom, which is a known hepatotoxic agent.  Suspect SILI (perhaps with a component of DILI with recent antibiotic), and will evaluate as below:  -Repeat liver enzymes today.  If downtrending can repeat again in 3 to 6 months to ensure normalization.  If uptrending, may need to consider serial monitoring versus extended serologic evaluation - Viral hepatitis panel negative - Continue to avoid all herbal supplements  3) Hematochezia: 2 isolated episodes of BRBPR without anemia.  That is since resolved.  No further evaluation at this time.  If recurrence, can certainly evaluate for etiology at that time.  4) Radiographic colitis: Mild thickening of the transverse and descending colon noted on initial CT but was not appreciated on the subsequent CT.  Unclear if this is true inflammation versus motion artifact or perhaps regional inflammation from possibly primary gastric etiology as above.  Nonetheless, repeat CT demonstrated resolution and she is otherwise without lower GI symptoms.  No further evaluation at this time.  5) Nausea:  Will evaluate for gastric etiology at time of EGD as above.  Otherwise, seems to be tolerating p.o. intake without need for antiemetics.   - Completed FMLA paperwork for recent missed time and plan for ongoing evaluation and tx  The indications, risks, and benefits of EGD were explained to the patient in detail. Risks include but are not limited to bleeding, perforation, adverse reaction to medications, and cardiopulmonary compromise. Sequelae include but are not limited to the possibility of surgery, hositalization, and mortality. The patient verbalized understanding and wished to proceed. All questions answered, referred to scheduler. Further recommendations pending results of the exam.    Shellia Cleverly, DO, FACG  05/14/2018, 3:01  PM   Arvind, Idelia Salm, MD

## 2018-05-15 ENCOUNTER — Other Ambulatory Visit: Payer: Self-pay

## 2018-05-15 DIAGNOSIS — R7401 Elevation of levels of liver transaminase levels: Secondary | ICD-10-CM

## 2018-05-15 DIAGNOSIS — R740 Nonspecific elevation of levels of transaminase and lactic acid dehydrogenase [LDH]: Principal | ICD-10-CM

## 2018-05-15 LAB — HEPATIC FUNCTION PANEL
ALK PHOS: 170 U/L — AB (ref 39–117)
ALT: 68 U/L — AB (ref 0–35)
AST: 33 U/L (ref 0–37)
Albumin: 4.6 g/dL (ref 3.5–5.2)
BILIRUBIN DIRECT: 0.3 mg/dL (ref 0.0–0.3)
Total Bilirubin: 1 mg/dL (ref 0.2–1.2)
Total Protein: 7.2 g/dL (ref 6.0–8.3)

## 2018-05-30 ENCOUNTER — Ambulatory Visit: Payer: BLUE CROSS/BLUE SHIELD | Admitting: Gastroenterology

## 2018-05-30 NOTE — Progress Notes (Signed)
32 yo female initially seen by me on 05/14/2018 for ER follow-up for LUQ pain, elevated liver enzymes, radiographic colitis, nausea, and incidentally noted hepatic cyst.  For her upper GI symptoms she was treated with Protonix 40 mg p.o. twice daily with plan for EGD today.  She presents today and states that her symptoms have completely resolved with high-dose acid suppression therapy and does not wish to proceed with EGD.  Tolerating all p.o. intake without issue and no further abdominal pain or nausea.  Weight is remained stable.  No overt GI blood loss (previously endorsed 2 isolated episodes of BRBPR without anemia which had resolved by the time she was evaluated in clinic).  Per patient request, will hold off on diagnostic EGD today.  Given complete resolution of symptoms no further testing at this time will proceed as below.  No change in past medical history, past surgical history, medications, social history, allergies from last visit aside from where noted.  ROS: As in HPI  Physical exam: Deferred  Impression and plan:  1) LUQ pain, nausea: - Reduce Protonix to 40 mg daily x4 weeks then titrate to lowest effective dose or discontinue completely if no return of index symptoms. -If recurrence of index symptoms can always follow-up with me in clinic  2) Elevated liver enzymes: Downtrending on repeat enzymes last month.  Suspect secondary to recent herbal medication use (Kratom) which she has since discontinued. -Repeat liver enzymes in 3 months to ensure normalization -Continue to avoid all herbal supplements  3) Hepatic cyst: Recent RUQ ultrasound with incidentally noted 12 mm hepatic cyst in the right superior lobe with otherwise normal-appearing liver and patent Dopplers. - Can repeat hepatic imaging in 6 to 12 months to ensure stability of cyst.  Otherwise without symptoms.  RTC in 3 months for routine follow-up or sooner as needed.  All questions answered.

## 2022-07-29 ENCOUNTER — Other Ambulatory Visit: Payer: Self-pay

## 2022-07-29 ENCOUNTER — Emergency Department (HOSPITAL_BASED_OUTPATIENT_CLINIC_OR_DEPARTMENT_OTHER)
Admission: EM | Admit: 2022-07-29 | Discharge: 2022-07-29 | Disposition: A | Discharge status: Home / Self Care | Payer: BLUE CROSS/BLUE SHIELD | Attending: Emergency Medicine | Admitting: Emergency Medicine

## 2022-07-29 ENCOUNTER — Encounter (HOSPITAL_BASED_OUTPATIENT_CLINIC_OR_DEPARTMENT_OTHER): Payer: Self-pay | Admitting: Emergency Medicine

## 2022-07-29 DIAGNOSIS — Z87891 Personal history of nicotine dependence: Secondary | ICD-10-CM | POA: Insufficient documentation

## 2022-07-29 DIAGNOSIS — O133 Gestational [pregnancy-induced] hypertension without significant proteinuria, third trimester: Secondary | ICD-10-CM | POA: Diagnosis not present

## 2022-07-29 DIAGNOSIS — R1030 Lower abdominal pain, unspecified: Secondary | ICD-10-CM | POA: Insufficient documentation

## 2022-07-29 DIAGNOSIS — O26891 Other specified pregnancy related conditions, first trimester: Secondary | ICD-10-CM | POA: Insufficient documentation

## 2022-07-29 DIAGNOSIS — M545 Low back pain, unspecified: Secondary | ICD-10-CM | POA: Insufficient documentation

## 2022-07-29 DIAGNOSIS — R110 Nausea: Secondary | ICD-10-CM | POA: Diagnosis not present

## 2022-07-29 DIAGNOSIS — O26851 Spotting complicating pregnancy, first trimester: Secondary | ICD-10-CM | POA: Insufficient documentation

## 2022-07-29 DIAGNOSIS — Z3A01 Less than 8 weeks gestation of pregnancy: Secondary | ICD-10-CM | POA: Insufficient documentation

## 2022-07-29 DIAGNOSIS — O4690 Antepartum hemorrhage, unspecified, unspecified trimester: Secondary | ICD-10-CM

## 2022-07-29 LAB — URINALYSIS, ROUTINE W REFLEX MICROSCOPIC
Bilirubin Urine: NEGATIVE
Glucose, UA: NEGATIVE mg/dL
Ketones, ur: NEGATIVE mg/dL
Leukocytes,Ua: NEGATIVE
Nitrite: NEGATIVE
Protein, ur: NEGATIVE mg/dL
Specific Gravity, Urine: 1.02 (ref 1.005–1.030)
pH: 6.5 (ref 5.0–8.0)

## 2022-07-29 LAB — CBC WITH DIFFERENTIAL/PLATELET
Abs Immature Granulocytes: 0.02 10*3/uL (ref 0.00–0.07)
Basophils Absolute: 0.1 10*3/uL (ref 0.0–0.1)
Basophils Relative: 1 %
Eosinophils Absolute: 0.1 10*3/uL (ref 0.0–0.5)
Eosinophils Relative: 1 %
HCT: 35.8 % — ABNORMAL LOW (ref 36.0–46.0)
Hemoglobin: 12.2 g/dL (ref 12.0–15.0)
Immature Granulocytes: 0 %
Lymphocytes Relative: 21 %
Lymphs Abs: 1.8 10*3/uL (ref 0.7–4.0)
MCH: 31.4 pg (ref 26.0–34.0)
MCHC: 34.1 g/dL (ref 30.0–36.0)
MCV: 92.3 fL (ref 80.0–100.0)
Monocytes Absolute: 0.6 10*3/uL (ref 0.1–1.0)
Monocytes Relative: 8 %
Neutro Abs: 5.7 10*3/uL (ref 1.7–7.7)
Neutrophils Relative %: 69 %
Platelets: 359 10*3/uL (ref 150–400)
RBC: 3.88 MIL/uL (ref 3.87–5.11)
RDW: 13.6 % (ref 11.5–15.5)
WBC: 8.3 10*3/uL (ref 4.0–10.5)
nRBC: 0 % (ref 0.0–0.2)

## 2022-07-29 LAB — ABO/RH: ABO/RH(D): AB POS

## 2022-07-29 LAB — BASIC METABOLIC PANEL
Anion gap: 6 (ref 5–15)
BUN: 11 mg/dL (ref 6–20)
CO2: 23 mmol/L (ref 22–32)
Calcium: 8.6 mg/dL — ABNORMAL LOW (ref 8.9–10.3)
Chloride: 103 mmol/L (ref 98–111)
Creatinine, Ser: 0.58 mg/dL (ref 0.44–1.00)
GFR, Estimated: >60 mL/min (ref >60)
Glucose, Bld: 107 mg/dL — ABNORMAL HIGH (ref 70–99)
Potassium: 3.5 mmol/L (ref 3.5–5.1)
Sodium: 132 mmol/L — ABNORMAL LOW (ref 135–145)

## 2022-07-29 LAB — URINALYSIS, MICROSCOPIC (REFLEX)

## 2022-07-29 LAB — HCG, QUANTITATIVE, PREGNANCY: hCG, Beta Chain, Quant, S: 912 m[IU]/mL — ABNORMAL HIGH (ref <5)

## 2022-07-29 LAB — WET PREP, GENITAL
Clue Cells Wet Prep HPF POC: NONE SEEN
Sperm: NONE SEEN
Trich, Wet Prep: NONE SEEN
WBC, Wet Prep HPF POC: >=10 — AB (ref <10)

## 2022-07-29 NOTE — ED Provider Notes (Signed)
Emergency Department Provider Note   I have reviewed the triage vital signs and the nursing notes.   HISTORY  Chief Complaint Vaginal Bleeding   HPI Mckenzie Williams is a 37 y.o. female G2P1001 presents to the emergency department in early pregnancy for evaluation of heavy vaginal bleeding and cramping.  Patient's last menstrual cycle was 11/15.  She presented to her PCP 3 days prior with a light gray discharge and apparently tested positive for pregnancy at that time.  She had a negative pregnancy test in January.  This morning, she developed heavy vaginal bleeding with clot passage.  Standing some lower abdominal discomfort but mainly pain in her lower back.  No syncope, chest pain, shortness of breath.  Some mild nausea but no vomiting.  She is not undergoing fertility treatment.    Past Medical History:  Diagnosis Date   Anxiety    Chronic headaches    Colitis    Hypertension    Ovarian cyst     Review of Systems  Constitutional: No fever/chills Eyes: No visual changes. ENT: No sore throat. Cardiovascular: Denies chest pain. Respiratory: Denies shortness of breath. Gastrointestinal: Positive lower abdominal pain.  No nausea, no vomiting.  No diarrhea.  No constipation. Genitourinary: Negative for dysuria. Positive vaginal bleeding.  Musculoskeletal: Positive for back pain. Skin: Negative for rash. Neurological: Negative for headaches, focal weakness or numbness.  ____________________________________________   PHYSICAL EXAM:  VITAL SIGNS: ED Triage Vitals [07/29/22 0820]  Enc Vitals Group     BP (!) 154/108     Pulse Rate (!) 109     Resp 16     Temp 97.8 F (36.6 C)     Temp Source Oral     SpO2 97 %   Constitutional: Alert and oriented. Well appearing and in no acute distress. Eyes: Conjunctivae are normal. Head: Atraumatic. Nose: No congestion/rhinnorhea. Mouth/Throat: Mucous membranes are moist. Neck: No stridor.   Cardiovascular: Normal rate,  regular rhythm. Good peripheral circulation. Grossly normal heart sounds.   Respiratory: Normal respiratory effort.  No retractions. Lungs CTAB. Gastrointestinal: Soft and nontender. No distention.  Genitourinary: Exam performed with patient's verbal consent and nurse tech chaperone at bedside for exam.  Normal external genitalia.  Trace blood in the vaginal vault without clots or tissue.  Cervix visually closed. No pooling fluid.  Musculoskeletal: No lower extremity tenderness nor edema. No gross deformities of extremities. Neurologic:  Normal speech and language. No gross focal neurologic deficits are appreciated.  Skin:  Skin is warm, dry and intact. No rash noted.  ____________________________________________   LABS (all labs ordered are listed, but only abnormal results are displayed)  Labs Reviewed  WET PREP, GENITAL - Abnormal; Notable for the following components:      Result Value   Yeast Wet Prep HPF POC PRESENT (*)    WBC, Wet Prep HPF POC >=10 (*)    All other components within normal limits  BASIC METABOLIC PANEL - Abnormal; Notable for the following components:   Sodium 132 (*)    Glucose, Bld 107 (*)    Calcium 8.6 (*)    All other components within normal limits  CBC WITH DIFFERENTIAL/PLATELET - Abnormal; Notable for the following components:   HCT 35.8 (*)    All other components within normal limits  HCG, QUANTITATIVE, PREGNANCY - Abnormal; Notable for the following components:   hCG, Beta Chain, Quant, S 912 (*)    All other components within normal limits  URINALYSIS, ROUTINE W REFLEX  MICROSCOPIC - Abnormal; Notable for the following components:   Hgb urine dipstick SMALL (*)    All other components within normal limits  URINALYSIS, MICROSCOPIC (REFLEX) - Abnormal; Notable for the following components:   Bacteria, UA RARE (*)    All other components within normal limits  ABO/RH  GC/CHLAMYDIA PROBE AMP ([place]) NOT AT [facility]     ____________________________________________   PROCEDURES  Procedure(s) performed:   Procedures  None  ____________________________________________   INITIAL IMPRESSION / ASSESSMENT AND PLAN / ED COURSE  Pertinent labs & imaging results that were available during my care of the patient were reviewed by me and considered in my medical decision making (see chart for details).   This patient is Presenting for Evaluation of abdominal pain, which does require a range of treatment options, and is a complaint that involves a high risk of morbidity and mortality.  The Differential Diagnoses includes but is not exclusive to ectopic pregnancy, ovarian cyst, ovarian torsion, acute appendicitis, urinary tract infection, endometriosis, bowel obstruction, hernia, colitis, renal colic, gastroenteritis, volvulus etc.   Critical Interventions-    Medications - No data to display  Reassessment after intervention:    I decided to review pertinent External Data, and in summary unable to find prior ABO/Rh labs.   Clinical Laboratory Tests Ordered, included RH positive. Quant hCG 912. UA with rare bacteria. CBC without leukocytosis.   Radiologic Tests: Planned for pelvic US but patient left prior to completion.   Cardiac Monitor Tracing which shows NSR.    Social Determinants of Health Risk patient with a smoking history.   Medical Decision Making: Summary:  Patient presents to the emergency department for evaluation of heavy vaginal bleeding and cramping in the setting of likely early pregnancy.  Plan for screening blood work including quantitative hCG along with pelvic exam and reassess.  Reevaluation with update and discussion with patient at 09:40 AM.  She is advised nursing staff that she must leave immediately.  Her mother is experiencing a medical emergency and has been transported to a local emergency department.  She is apparently unresponsive and the patient is her medical power  of attorney.  Her hCG quant is at 913.  We discussed that ideally we would wait for her ABO Rh and perform a pelvic ultrasound.  She advises that she understands but will have to leave and return for further workup.  We discussed strict ED return precaution and follow-up plan.  Patient's presentation is most consistent with acute presentation with potential threat to life or bodily function.   Disposition: discharge  ____________________________________________  FINAL CLINICAL IMPRESSION(S) / ED DIAGNOSES  Final diagnoses:  Vaginal bleeding in pregnancy    Note:  This document was prepared using Dragon voice recognition software and may include unintentional dictation errors.  Nanda Quinton, MD, [facility] Emergency Medicine    Melani Brisbane, [facility], MD 07/29/22 [phone]

## 2022-07-29 NOTE — ED Triage Notes (Addendum)
Pt reports she had a positive pregnancy test at PCP office. Had light grey discharge 3 days ago. This morning had cramping worse in back and bleeding followed by spotting. Last regular period was 12/15.

## 2022-07-29 NOTE — Discharge Instructions (Signed)
We discussed additional testing that we will do today and blood work that needs to resolve.  I understand that you have a family emergency and I will discharge you see can be with your family.  Please return soon as you are able to continue your workup or if symptoms worsen please check into the nearest emergency department for reevaluation.

## 2022-07-31 LAB — GC/CHLAMYDIA PROBE AMP (~~LOC~~) NOT AT ARMC
Chlamydia: NEGATIVE
Comment: NEGATIVE
Comment: NORMAL
Neisseria Gonorrhea: NEGATIVE
